# Patient Record
Sex: Female | Born: 1990
Health system: Southern US, Community
[De-identification: ages and names within clinical notes are randomized; demographics above are authoritative.]

## PROBLEM LIST (undated history)

## (undated) DIAGNOSIS — L509 Urticaria, unspecified: Secondary | ICD-10-CM

## (undated) HISTORY — DX: Urticaria, unspecified: L50.9

## (undated) HISTORY — PX: WISDOM TOOTH EXTRACTION: SHX21

---

## 2015-11-01 ENCOUNTER — Encounter (HOSPITAL_COMMUNITY): Payer: Self-pay

## 2015-11-01 ENCOUNTER — Emergency Department (HOSPITAL_COMMUNITY)
Admission: EM | Admit: 2015-11-01 | Discharge: 2015-11-02 | Disposition: A | Discharge status: Home / Self Care | Payer: 59 | Attending: Emergency Medicine | Admitting: Emergency Medicine

## 2015-11-01 DIAGNOSIS — R1013 Epigastric pain: Secondary | ICD-10-CM | POA: Insufficient documentation

## 2015-11-01 DIAGNOSIS — R350 Frequency of micturition: Secondary | ICD-10-CM | POA: Insufficient documentation

## 2015-11-01 DIAGNOSIS — I951 Orthostatic hypotension: Secondary | ICD-10-CM | POA: Insufficient documentation

## 2015-11-01 DIAGNOSIS — R300 Dysuria: Secondary | ICD-10-CM | POA: Insufficient documentation

## 2015-11-01 DIAGNOSIS — Z3202 Encounter for pregnancy test, result negative: Secondary | ICD-10-CM | POA: Diagnosis not present

## 2015-11-01 DIAGNOSIS — E860 Dehydration: Secondary | ICD-10-CM | POA: Insufficient documentation

## 2015-11-01 DIAGNOSIS — K920 Hematemesis: Secondary | ICD-10-CM | POA: Diagnosis present

## 2015-11-01 DIAGNOSIS — R112 Nausea with vomiting, unspecified: Secondary | ICD-10-CM

## 2015-11-01 LAB — POC URINE PREG, ED: PREG TEST UR: NEGATIVE

## 2015-11-01 NOTE — ED Provider Notes (Signed)
CSN: [id number]     Arrival date & time 11/01/15  2319 History  By signing my name below, I, Phillis Haggis, attest that this documentation has been prepared under the direction and in the presence of Dione Booze, MD. Electronically Signed: Phillis Haggis, ED Scribe. 11/01/2015. 12:03 AM.   Chief Complaint  Patient presents with  . Hematemesis   The history is provided by the patient. No language interpreter was used.  HPI Comments: Crystal Simpson is a 25 y.o. female who presents to the Emergency Department complaining of hematemesis x2 onset 4 hours ago. Pt reports vomiting 6x, noticing small blood clots, about 3, the first two times. She states that she continued to see streaks of blood, in the following emesis episodes. She reports associated lightheadedness, dizziness, nausea, frequency, and dysuria. She denies fever, chills, diaphoresis, diarrhea, constipation, urgency, or abdominal pain.  History reviewed. No pertinent past medical history. Past Surgical History  Procedure Laterality Date  . Cesarean section     No family history on file. Social History  Substance Use Topics  . Smoking status: Never Smoker   . Smokeless tobacco: None  . Alcohol Use: No   OB History    No data available     Review of Systems  Constitutional: Negative for fever and chills.  Gastrointestinal: Positive for nausea and vomiting. Negative for abdominal pain, diarrhea and constipation.  Genitourinary: Positive for dysuria and frequency. Negative for urgency.  Neurological: Positive for dizziness and light-headedness.  All other systems reviewed and are negative.  Allergies  Review of patient's allergies indicates no known allergies.  Home Medications   Prior to Admission medications   Not on File   BP 121/74 mmHg  Pulse 86  Temp(Src) 98.4 F (36.9 C) (Oral)  Resp 16  Wt 161 lb 9 oz (73.284 kg)  SpO2 98% Physical Exam  Constitutional: She is oriented to person, place, and time. She  appears well-developed and well-nourished.  HENT:  Head: Normocephalic and atraumatic.  Eyes: EOM are normal. Pupils are equal, round, and reactive to light.  Neck: Normal range of motion. Neck supple. No JVD present.  Cardiovascular: Normal rate, regular rhythm and normal heart sounds.  Exam reveals no gallop and no friction rub.   No murmur heard. Pulmonary/Chest: Effort normal and breath sounds normal. She has no wheezes. She has no rales. She exhibits no tenderness.  Abdominal: Soft. She exhibits no distension and no mass. There is tenderness in the epigastric area.  Mild epigastric tenderness  Musculoskeletal: Normal range of motion. She exhibits no edema.  Lymphadenopathy:    She has no cervical adenopathy.  Neurological: She is alert and oriented to person, place, and time. No cranial nerve deficit. She exhibits normal muscle tone. Coordination normal.  Skin: Skin is warm and dry. No rash noted.  Psychiatric: She has a normal mood and affect. Her behavior is normal. Judgment and thought content normal.  Nursing note and vitals reviewed.   ED Course  Procedures (including critical care time) DIAGNOSTIC STUDIES: Oxygen Saturation is 98% on RA, normal by my interpretation.    COORDINATION OF CARE: 12:01 AM-Discussed treatment plan which includes labs and IV fluids with pt at bedside and pt agreed to plan.    Labs Review Results for orders placed or performed during the hospital encounter of 11/01/15  Lipase, blood  Result Value Ref Range   Lipase 24 11 - 51 U/L  Comprehensive metabolic panel  Result Value Ref Range   Sodium 141 135 -  145 mmol/L   Potassium 3.6 3.5 - 5.1 mmol/L   Chloride 106 101 - 111 mmol/L   CO2 26 22 - 32 mmol/L   Glucose, Bld 109 (H) 65 - 99 mg/dL   BUN 7 6 - 20 mg/dL   Creatinine, Ser 1.61 0.44 - 1.00 mg/dL   Calcium 9.6 8.9 - 09.6 mg/dL   Total Protein 7.2 6.5 - 8.1 g/dL   Albumin 4.0 3.5 - 5.0 g/dL   AST 15 15 - 41 U/L   ALT 10 (L) 14 - 54 U/L    Alkaline Phosphatase 52 38 - 126 U/L   Total Bilirubin 0.8 0.3 - 1.2 mg/dL   GFR calc non Af Amer >60 >60 mL/min   GFR calc Af Amer >60 >60 mL/min   Anion gap 9 5 - 15  CBC  Result Value Ref Range   WBC 14.5 (H) 4.0 - 10.5 K/uL   RBC 4.31 3.87 - 5.11 MIL/uL   Hemoglobin 12.7 12.0 - 15.0 g/dL   HCT 04.5 40.9 - 81.1 %   MCV 90.0 78.0 - 100.0 fL   MCH 29.5 26.0 - 34.0 pg   MCHC 32.7 30.0 - 36.0 g/dL   RDW 91.4 78.2 - 95.6 %   Platelets 289 150 - 400 K/uL  Urinalysis, Routine w reflex microscopic (not at [facility])  Result Value Ref Range   Color, Urine YELLOW YELLOW   APPearance CLOUDY (A) CLEAR   Specific Gravity, Urine 1.027 1.005 - 1.030   pH 7.5 5.0 - 8.0   Glucose, UA NEGATIVE NEGATIVE mg/dL   Hgb urine dipstick NEGATIVE NEGATIVE   Bilirubin Urine NEGATIVE NEGATIVE   Ketones, ur NEGATIVE NEGATIVE mg/dL   Protein, ur NEGATIVE NEGATIVE mg/dL   Nitrite POSITIVE (A) NEGATIVE   Leukocytes, UA SMALL (A) NEGATIVE  Differential  Result Value Ref Range   Neutrophils Relative % 70 %   Neutro Abs 10.1 (H) 1.7 - 7.7 K/uL   Lymphocytes Relative 21 %   Lymphs Abs 3.1 0.7 - 4.0 K/uL   Monocytes Relative 8 %   Monocytes Absolute 1.1 (H) 0.1 - 1.0 K/uL   Eosinophils Relative 1 %   Eosinophils Absolute 0.2 0.0 - 0.7 K/uL   Basophils Relative 0 %   Basophils Absolute 0.0 0.0 - 0.1 K/uL  Urine microscopic-add on  Result Value Ref Range   Squamous Epithelial / LPF 6-30 (A) NONE SEEN   WBC, UA 0-5 0 - 5 WBC/hpf   RBC / HPF NONE SEEN 0 - 5 RBC/hpf   Bacteria, UA MANY (A) NONE SEEN  Hemoglobin and hematocrit, blood  Result Value Ref Range   Hemoglobin 11.7 (L) 12.0 - 15.0 g/dL   HCT 21.3 08.6 - 57.8 %  Hemoglobin and hematocrit, blood  Result Value Ref Range   Hemoglobin 11.8 (L) 12.0 - 15.0 g/dL   HCT 46.9 (L) 62.9 - 52.8 %  POC urine preg, ED (not at [facility])  Result Value Ref Range   Preg Test, Ur NEGATIVE NEGATIVE   I have personally reviewed and evaluated these lab results as part  of my medical decision-making.   MDM   Final diagnoses:  Nausea and vomiting, vomiting of unspecified type  Dehydration  Orthostatic hypotension    Nausea and vomiting with questionable hematemesis. I doubt significant hematemesis and there is no emesis present to test. Clinically, she is acting as a viral gastritis. Orthostatic vital signs were obtained which did show significant rise in pulse. She was given IV  hydration with ondansetron with some improvement in nausea. Hemoglobin was rechecked and had dropped but I was suspicious that this actually was from volume dilution rather than actual hemorrhage. She is observed in the ED and hemoglobin repeated again and was stable. Orthostatic vital signs were checked and she no longer had any significant orthostatic changes. She is discharged with prescription for ondansetron. She was feeling much better at this point.  I personally performed the services described in this documentation, which was scribed in my presence. The recorded information has been reviewed and is accurate.      Dione Booze, MD 11/02/15 [phone]

## 2015-11-01 NOTE — ED Notes (Signed)
Pt here for vomiting x 6 times and noticed blood in the first two times. Feels lightheaded and has been having pain after urinating.

## 2015-11-02 LAB — URINALYSIS, ROUTINE W REFLEX MICROSCOPIC
BILIRUBIN URINE: NEGATIVE
GLUCOSE, UA: NEGATIVE mg/dL
HGB URINE DIPSTICK: NEGATIVE
KETONES UR: NEGATIVE mg/dL
Nitrite: POSITIVE — AB
PH: 7.5 (ref 5.0–8.0)
Protein, ur: NEGATIVE mg/dL
SPECIFIC GRAVITY, URINE: 1.027 (ref 1.005–1.030)

## 2015-11-02 LAB — DIFFERENTIAL
Basophils Absolute: 0 10*3/uL (ref 0.0–0.1)
Basophils Relative: 0 %
EOS ABS: 0.2 10*3/uL (ref 0.0–0.7)
EOS PCT: 1 %
LYMPHS ABS: 3.1 10*3/uL (ref 0.7–4.0)
LYMPHS PCT: 21 %
MONO ABS: 1.1 10*3/uL — AB (ref 0.1–1.0)
Monocytes Relative: 8 %
NEUTROS PCT: 70 %
Neutro Abs: 10.1 10*3/uL — ABNORMAL HIGH (ref 1.7–7.7)

## 2015-11-02 LAB — LIPASE, BLOOD: LIPASE: 24 U/L (ref 11–51)

## 2015-11-02 LAB — URINE MICROSCOPIC-ADD ON: RBC / HPF: NONE SEEN RBC/hpf (ref 0–5)

## 2015-11-02 LAB — COMPREHENSIVE METABOLIC PANEL
ALK PHOS: 52 U/L (ref 38–126)
ALT: 10 U/L — AB (ref 14–54)
AST: 15 U/L (ref 15–41)
Albumin: 4 g/dL (ref 3.5–5.0)
Anion gap: 9 (ref 5–15)
BUN: 7 mg/dL (ref 6–20)
CALCIUM: 9.6 mg/dL (ref 8.9–10.3)
CHLORIDE: 106 mmol/L (ref 101–111)
CO2: 26 mmol/L (ref 22–32)
CREATININE: 0.66 mg/dL (ref 0.44–1.00)
GFR calc non Af Amer: >60 mL/min (ref >60)
Glucose, Bld: 109 mg/dL — ABNORMAL HIGH (ref 65–99)
Potassium: 3.6 mmol/L (ref 3.5–5.1)
SODIUM: 141 mmol/L (ref 135–145)
Total Bilirubin: 0.8 mg/dL (ref 0.3–1.2)
Total Protein: 7.2 g/dL (ref 6.5–8.1)

## 2015-11-02 LAB — CBC
HCT: 38.8 % (ref 36.0–46.0)
Hemoglobin: 12.7 g/dL (ref 12.0–15.0)
MCH: 29.5 pg (ref 26.0–34.0)
MCHC: 32.7 g/dL (ref 30.0–36.0)
MCV: 90 fL (ref 78.0–100.0)
Platelets: 289 10*3/uL (ref 150–400)
RBC: 4.31 MIL/uL (ref 3.87–5.11)
RDW: 13.7 % (ref 11.5–15.5)
WBC: 14.5 10*3/uL — ABNORMAL HIGH (ref 4.0–10.5)

## 2015-11-02 LAB — HEMOGLOBIN AND HEMATOCRIT, BLOOD
HEMATOCRIT: 36.1 % (ref 36.0–46.0)
HEMATOCRIT: 35.9 % — AB (ref 36.0–46.0)
HEMOGLOBIN: 11.7 g/dL — AB (ref 12.0–15.0)
HEMOGLOBIN: 11.8 g/dL — AB (ref 12.0–15.0)

## 2015-11-02 MED ORDER — SODIUM CHLORIDE 0.9 % IV SOLN
1000.0000 mL | Freq: Once | INTRAVENOUS | Status: AC
  Administered 2015-11-02: 1000 mL via INTRAVENOUS

## 2015-11-02 MED ORDER — PANTOPRAZOLE SODIUM 40 MG IV SOLR
40.0000 mg | Freq: Once | INTRAVENOUS | Status: AC
  Administered 2015-11-02: 40 mg via INTRAVENOUS
  Filled 2015-11-02: qty 40

## 2015-11-02 MED ORDER — ONDANSETRON HCL 4 MG/2ML IJ SOLN
4.0000 mg | Freq: Once | INTRAMUSCULAR | Status: AC
  Administered 2015-11-02: 4 mg via INTRAVENOUS
  Filled 2015-11-02: qty 2

## 2015-11-02 MED ORDER — ONDANSETRON HCL 4 MG PO TABS: 4.0000 mg | ORAL_TABLET | Freq: Four times a day (QID) | ORAL | Status: DC

## 2015-11-02 MED ORDER — SODIUM CHLORIDE 0.9 % IV SOLN
1000.0000 mL | INTRAVENOUS | Status: DC
  Administered 2015-11-02: 1000 mL via INTRAVENOUS

## 2015-11-02 NOTE — Discharge Instructions (Signed)
Nausea and Vomiting  Nausea is a sick feeling that often comes before throwing up (vomiting). Vomiting is a reflex where stomach contents come out of your mouth. Vomiting can cause severe loss of body fluids (dehydration). Children and elderly adults can become dehydrated quickly, especially if they also have diarrhea. Nausea and vomiting are symptoms of a condition or disease. It is important to find the cause of your symptoms.  CAUSES   · Direct irritation of the stomach lining. This irritation can result from increased acid production (gastroesophageal reflux disease), infection, food poisoning, taking certain medicines (such as nonsteroidal anti-inflammatory drugs), alcohol use, or tobacco use.  · Signals from the brain. These signals could be caused by a headache, heat exposure, an inner ear disturbance, increased pressure in the brain from injury, infection, a tumor, or a concussion, pain, emotional stimulus, or metabolic problems.  · An obstruction in the gastrointestinal tract (bowel obstruction).  · Illnesses such as diabetes, hepatitis, gallbladder problems, appendicitis, kidney problems, cancer, sepsis, atypical symptoms of a heart attack, or eating disorders.  · Medical treatments such as chemotherapy and radiation.  · Receiving medicine that makes you sleep (general anesthetic) during surgery.  DIAGNOSIS  Your caregiver may ask for tests to be done if the problems do not improve after a few days. Tests may also be done if symptoms are severe or if the reason for the nausea and vomiting is not clear. Tests may include:  · Urine tests.  · Blood tests.  · Stool tests.  · Cultures (to look for evidence of infection).  · X-rays or other imaging studies.  Test results can help your caregiver make decisions about treatment or the need for additional tests.  TREATMENT  You need to stay well hydrated. Drink frequently but in small amounts. You may wish to drink water, sports drinks, clear broth, or eat frozen  ice pops or gelatin dessert to help stay hydrated. When you eat, eating slowly may help prevent nausea. There are also some antinausea medicines that may help prevent nausea.  HOME CARE INSTRUCTIONS   · Take all medicine as directed by your caregiver.  · If you do not have an appetite, do not force yourself to eat. However, you must continue to drink fluids.  · If you have an appetite, eat a normal diet unless your caregiver tells you differently.    Eat a variety of complex carbohydrates (rice, wheat, potatoes, bread), lean meats, yogurt, fruits, and vegetables.    Avoid high-fat foods because they are more difficult to digest.  · Drink enough water and fluids to keep your urine clear or pale yellow.  · If you are dehydrated, ask your caregiver for specific rehydration instructions. Signs of dehydration may include:    Severe thirst.    Dry lips and mouth.    Dizziness.    Dark urine.    Decreasing urine frequency and amount.    Confusion.    Rapid breathing or pulse.  SEEK IMMEDIATE MEDICAL CARE IF:   · You have blood or brown flecks (like coffee grounds) in your vomit.  · You have black or bloody stools.  · You have a severe headache or stiff neck.  · You are confused.  · You have severe abdominal pain.  · You have chest pain or trouble breathing.  · You do not urinate at least once every 8 hours.  · You develop cold or clammy skin.  · You continue to vomit for longer than 24 to 48 hours.  ·   You have a fever.  MAKE SURE YOU:   · Understand these instructions.  · Will watch your condition.  · Will get help right away if you are not doing well or get worse.     This information is not intended to replace advice given to you by your health care provider. Make sure you discuss any questions you have with your health care provider.     Document Released: 09/13/2005 Document Revised: 12/06/2011 Document Reviewed: 02/10/2011  Elsevier Interactive Patient Education ©2016 Elsevier [employer].  Ondansetron tablets  What is this  medicine?  ONDANSETRON (on DAN se tron) is used to treat nausea and vomiting caused by chemotherapy. It is also used to prevent or treat nausea and vomiting after surgery.  This medicine may be used for other purposes; ask your health care provider or pharmacist if you have questions.  What should I tell my health care provider before I take this medicine?  They need to know if you have any of these conditions:  -heart disease  -history of irregular heartbeat  -liver disease  -low levels of magnesium or potassium in the blood  -an unusual or allergic reaction to ondansetron, granisetron, other medicines, foods, dyes, or preservatives  -pregnant or trying to get pregnant  -breast-feeding  How should I use this medicine?  Take this medicine by mouth with a glass of water. Follow the directions on your prescription label. Take your doses at regular intervals. Do not take your medicine more often than directed.  Talk to your pediatrician regarding the use of this medicine in children. Special care may be needed.  Overdosage: If you think you have taken too much of this medicine contact a poison control center or emergency room at once.  NOTE: This medicine is only for you. Do not share this medicine with others.  What if I miss a dose?  If you miss a dose, take it as soon as you can. If it is almost time for your next dose, take only that dose. Do not take double or extra doses.  What may interact with this medicine?  Do not take this medicine with any of the following medications:  -apomorphine  -certain medicines for fungal infections like fluconazole, itraconazole, ketoconazole, posaconazole, voriconazole  -cisapride  -dofetilide  -dronedarone  -pimozide  -thioridazine  -ziprasidone  This medicine may also interact with the following medications:  -carbamazepine  -certain medicines for depression, anxiety, or psychotic disturbances  -fentanyl  -linezolid  -MAOIs like Carbex, Eldepryl, Marplan, Nardil, and  Parnate  -methylene blue (injected into a vein)  -other medicines that prolong the QT interval (cause an abnormal heart rhythm)  -phenytoin  -rifampicin  -tramadol  This list may not describe all possible interactions. Give your health care provider a list of all the medicines, herbs, non-prescription drugs, or dietary supplements you use. Also tell them if you smoke, drink alcohol, or use illegal drugs. Some items may interact with your medicine.  What should I watch for while using this medicine?  Check with your doctor or health care professional right away if you have any sign of an allergic reaction.  What side effects may I notice from receiving this medicine?  Side effects that you should report to your doctor or health care professional as soon as possible:  -allergic reactions like skin rash, itching or hives, swelling of the face, lips or tongue  -breathing problems  -confusion  -dizziness  -fast or irregular heartbeat  -feeling faint or lightheaded,   falls  -fever and chills  -loss of balance or coordination  -seizures  -sweating  -swelling of the hands or feet  -tightness in the chest  -tremors  -unusually weak or tired  Side effects that usually do not require medical attention (report to your doctor or health care professional if they continue or are bothersome):  -constipation or diarrhea  -headache  This list may not describe all possible side effects. Call your doctor for medical advice about side effects. You may report side effects to FDA at 1-800-FDA-1088.  Where should I keep my medicine?  Keep out of the reach of children.  Store between 2 and 30 degrees C (36 and 86 degrees F). Throw away any unused medicine after the expiration date.  NOTE: This sheet is a summary. It may not cover all possible information. If you have questions about this medicine, talk to your doctor, pharmacist, or health care provider.     © 2016, Elsevier/Gold Standard. (2013-06-20 16:27:45)

## 2016-10-14 ENCOUNTER — Emergency Department (HOSPITAL_COMMUNITY)
Admission: EM | Admit: 2016-10-14 | Discharge: 2016-10-14 | Disposition: A | Discharge status: Home / Self Care | Payer: 59 | Attending: Emergency Medicine | Admitting: Emergency Medicine

## 2016-10-14 ENCOUNTER — Encounter (HOSPITAL_COMMUNITY): Payer: Self-pay

## 2016-10-14 DIAGNOSIS — N3000 Acute cystitis without hematuria: Secondary | ICD-10-CM

## 2016-10-14 DIAGNOSIS — Z79899 Other long term (current) drug therapy: Secondary | ICD-10-CM | POA: Insufficient documentation

## 2016-10-14 DIAGNOSIS — Z202 Contact with and (suspected) exposure to infections with a predominantly sexual mode of transmission: Secondary | ICD-10-CM

## 2016-10-14 LAB — PREGNANCY, URINE: Preg Test, Ur: NEGATIVE

## 2016-10-14 LAB — WET PREP, GENITAL
Sperm: NONE SEEN
TRICH WET PREP: NONE SEEN
Yeast Wet Prep HPF POC: NONE SEEN

## 2016-10-14 LAB — URINALYSIS, ROUTINE W REFLEX MICROSCOPIC
Bilirubin Urine: NEGATIVE
Glucose, UA: NEGATIVE mg/dL
Hgb urine dipstick: NEGATIVE
KETONES UR: NEGATIVE mg/dL
Nitrite: NEGATIVE
PROTEIN: NEGATIVE mg/dL
SQUAMOUS EPITHELIAL / LPF: NONE SEEN
Specific Gravity, Urine: 1.025 (ref 1.005–1.030)
pH: 6 (ref 5.0–8.0)

## 2016-10-14 MED ORDER — CEPHALEXIN 500 MG PO CAPS: 500.0000 mg | ORAL_CAPSULE | Freq: Three times a day (TID) | ORAL | 0 refills | Status: DC

## 2016-10-14 MED ORDER — LIDOCAINE HCL 1 % IJ SOLN
INTRAMUSCULAR | Status: AC
  Administered 2016-10-14: 0.9 mL
  Filled 2016-10-14: qty 20

## 2016-10-14 MED ORDER — CEFTRIAXONE SODIUM 250 MG IJ SOLR
250.0000 mg | Freq: Once | INTRAMUSCULAR | Status: AC
  Administered 2016-10-14: 250 mg via INTRAMUSCULAR
  Filled 2016-10-14: qty 250

## 2016-10-14 MED ORDER — AZITHROMYCIN 250 MG PO TABS
1000.0000 mg | ORAL_TABLET | Freq: Once | ORAL | Status: AC
  Administered 2016-10-14: 1000 mg via ORAL
  Filled 2016-10-14: qty 4

## 2016-10-14 NOTE — ED Notes (Signed)
Patient d/c'd self care.  F/U and medications reviewed.  Patient verbalized understanding. 

## 2016-10-14 NOTE — ED Provider Notes (Signed)
[facility]-EMERGENCY DEPT Provider Note   CSN: [id number] Arrival date & time: 10/14/16  1618     History   Chief Complaint Chief Complaint  Patient presents with  . Exposure to STD    HPI Crystal Simpson is a 26 y.o. female who presents with STI exposure. No significant PMH. She states that her husband was notified that he tested positive for Chlamydia yesterday and she would like to be tested. She reports some mild discomfort from sex and intermittent dysuria. She has an IUD. Denies fever, chills, abdominal pain, N/V, vaginal discharge.   HPI  History reviewed. No pertinent past medical history.  There are no active problems to display for this patient.   Past Surgical History:  Procedure Laterality Date  . CESAREAN SECTION      OB History    No data available       Home Medications    Prior to Admission medications   Medication Sig Start Date End Date Taking? Authorizing Provider  levonorgestrel (MIRENA) 20 MCG/24HR IUD 1 each by Intrauterine route once.    Historical Provider, MD  ondansetron (ZOFRAN) 4 MG tablet Take 1 tablet (4 mg total) by mouth every 6 (six) hours. Patient not taking: Reported on 10/14/2016 11/02/15   Dione Boozeavid Glick, MD    Family History History reviewed. No pertinent family history.  Social History Social History  Substance Use Topics  . Smoking status: Never Smoker  . Smokeless tobacco: Never Used  . Alcohol use No     Allergies   Patient has no known allergies.   Review of Systems Review of Systems  Constitutional: Negative for chills and fever.  Gastrointestinal: Negative for abdominal pain, nausea and vomiting.  Genitourinary: Positive for dyspareunia and dysuria. Negative for genital sores, pelvic pain and vaginal discharge.  All other systems reviewed and are negative.    Physical Exam Updated Vital Signs BP 115/73 (BP Location: Left Arm)   Pulse 72   Temp 98 F (36.7 C) (Oral)   Resp 14   Ht 5\' 4"  (1.626 m)   Wt 82.4  kg   SpO2 99%   BMI 31.17 kg/m   Physical Exam  Constitutional: She is oriented to person, place, and time. She appears well-developed and well-nourished. No distress.  HENT:  Head: Normocephalic and atraumatic.  Eyes: Conjunctivae are normal. Pupils are equal, round, and reactive to light. Right eye exhibits no discharge. Left eye exhibits no discharge. No scleral icterus.  Neck: Normal range of motion.  Cardiovascular: Normal rate and regular rhythm.  Exam reveals no gallop and no friction rub.   No murmur heard. Pulmonary/Chest: Effort normal and breath sounds normal. No respiratory distress. She has no wheezes. She has no rales. She exhibits no tenderness.  Abdominal: Soft. Bowel sounds are normal. She exhibits no distension and no mass. There is no tenderness. There is no rebound and no guarding. No hernia.  Well-healed C-section scar  Genitourinary:  Genitourinary Comments: Pelvic: No inguinal lymphadenopathy or inguinal hernia noted. Normal external genitalia. No pain with speculum insertion. Closed cervical os with normal appearance - no rash or lesions. IUD strings in place. No significant discharge or bleeding noted from cervix or in vaginal vault. On bimanual examination no adnexal tenderness or cervical motion tenderness. Chaperone present during exam.    Neurological: She is alert and oriented to person, place, and time.  Skin: Skin is warm and dry.  Psychiatric: She has a normal mood and affect. Her behavior is normal.  Nursing  note and vitals reviewed.    ED Treatments / Results  Labs (all labs ordered are listed, but only abnormal results are displayed) Labs Reviewed  WET PREP, GENITAL - Abnormal; Notable for the following:       Result Value   Clue Cells Wet Prep HPF POC PRESENT (*)    WBC, Wet Prep HPF POC FEW (*)    All other components within normal limits  URINALYSIS, ROUTINE W REFLEX MICROSCOPIC - Abnormal; Notable for the following:    Leukocytes, UA SMALL  (*)    Bacteria, UA MANY (*)    All other components within normal limits  PREGNANCY, URINE  RPR  HIV ANTIBODY (ROUTINE TESTING)  GC/CHLAMYDIA PROBE AMP ([place]) NOT AT [facility]    EKG  EKG Interpretation None       Radiology No results found.  Procedures Procedures (including critical care time)  Medications Ordered in ED Medications  cefTRIAXone (ROCEPHIN) injection 250 mg (250 mg Intramuscular Given 10/14/16 1952)  azithromycin (ZITHROMAX) tablet 1,000 mg (1,000 mg Oral Given 10/14/16 1952)  lidocaine (XYLOCAINE) 1 % (with pres) injection (0.9 mLs  Given 10/14/16 1952)     Initial Impression / Assessment and Plan / ED Course  I have reviewed the triage vital signs and the nursing notes.  Pertinent labs & imaging results that were available during my care of the patient were reviewed by me and considered in my medical decision making (see chart for details).  26 year old female presents with STD exposure. Patient is afebrile, not tachycardic or tachypneic, normotensive, and not hypoxic. Pelvic exam is unremarkable. Wet prep shows clue cells and few WBC. Pt denies vaginal discharge so will not treat for BV. UA shows many bacteria, small leukocytes, 6-30 WBC. Will treat for UTI. G&C sent. HIV and RPR sent. Patient is NAD, non-toxic, with stable VS. Patient is informed of clinical course, understands medical decision making process, and agrees with plan. Opportunity for questions provided and all questions answered. Return precautions given.   Final Clinical Impressions(s) / ED Diagnoses   Final diagnoses:  STD exposure  Acute cystitis without hematuria    New Prescriptions New Prescriptions   No medications on file     Bethel Born, PA-C 10/14/16 2039    Melene Plan, DO 10/14/16 2319

## 2016-10-14 NOTE — ED Triage Notes (Signed)
PT STS HER HUSBAND HAS TESTED POSITIVE FOR CHLAMYDIA AND SHE WOULD LIKE TO BE CHECKED, AS WELL. PT DENIES VAGINAL D/C OR IRRITATION, BUT HAS BEEN HAVING URINARY FREQUENCY AND BURNING WITH URINATION AT TIMES.

## 2016-10-15 LAB — GC/CHLAMYDIA PROBE AMP (~~LOC~~) NOT AT ARMC
Chlamydia: POSITIVE — AB
NEISSERIA GONORRHEA: NEGATIVE

## 2016-10-15 LAB — RPR: RPR: NONREACTIVE

## 2016-10-15 LAB — HIV ANTIBODY (ROUTINE TESTING W REFLEX): HIV SCREEN 4TH GENERATION: NONREACTIVE

## 2017-09-15 ENCOUNTER — Other Ambulatory Visit: Payer: Self-pay

## 2017-09-15 ENCOUNTER — Encounter (HOSPITAL_COMMUNITY): Payer: Self-pay

## 2017-09-15 ENCOUNTER — Emergency Department (HOSPITAL_COMMUNITY)
Admission: EM | Admit: 2017-09-15 | Discharge: 2017-09-16 | Disposition: A | Discharge status: Home / Self Care | Payer: BLUE CROSS/BLUE SHIELD | Attending: Emergency Medicine | Admitting: Emergency Medicine

## 2017-09-15 ENCOUNTER — Emergency Department (HOSPITAL_COMMUNITY): Payer: BLUE CROSS/BLUE SHIELD

## 2017-09-15 DIAGNOSIS — Z975 Presence of (intrauterine) contraceptive device: Secondary | ICD-10-CM | POA: Insufficient documentation

## 2017-09-15 DIAGNOSIS — M79642 Pain in left hand: Secondary | ICD-10-CM | POA: Diagnosis not present

## 2017-09-15 NOTE — Discharge Instructions (Signed)
Your x-ray did not show a fracture/broken bone; however when you are tender over the area on your hand call the anatomic snuffbox, we treat that as if you have a scaphoid fracture.   Please wear the splint until you are seen by Dr. Merlyn LotKuzma in his office in about 1 week.  Keep the splint clean and dry.  You can cover it with a plastic bag if you need to shower.  Take 600 mg of ibuprofen with food or 650 mg of Tylenol every 6 hours as needed for pain control.   If you develop any new or worsening symptoms, including numbness or weakness in the right hand or fingers, if the fingers or fingertips become cold or blue, or other concerning symptoms, please return to the emergency department for re-evaluation.

## 2017-09-15 NOTE — ED Notes (Signed)
Ortho paged for thumb spica 

## 2017-09-15 NOTE — ED Triage Notes (Signed)
Pt endorses tripping on a piece of equipment at home and tried to catch herself with her left hand and now has left wrist/hand pain. Able to make fist and full ROM of wrist. VSS.

## 2017-09-15 NOTE — ED Notes (Signed)
See EDP assessment 

## 2017-09-15 NOTE — ED Provider Notes (Signed)
[facility] Provider Note   CSN: [id number] Arrival date & time: 09/15/17  2040     History   Chief Complaint Chief Complaint  Patient presents with  . Hand Pain    HPI Crystal Simpson is a 26 y.o. female who presents to the emergency department with a chief complaint of constant left hand pain that began at approximately 8:30 PM after she tripped while walking at home and placed the back of her left hand up to try and catch herself, when she feel backwards towards a wall.  She denies hitting her head, LOC, nausea, or emesis.  No previous left hand injuries or surgeries.  She reports that the pain radiates up the left thumb.  She denies numbness or weakness.  Patient is right-handed.  No treatment prior to arrival.  The history is provided by the patient. No language interpreter was used.    History reviewed. No pertinent past medical history.  There are no active problems to display for this patient.   Past Surgical History:  Procedure Laterality Date  . CESAREAN SECTION      OB History    No data available       Home Medications    Prior to Admission medications   Medication Sig Start Date End Date Taking? Authorizing Provider  cephALEXin (KEFLEX) 500 MG capsule Take 1 capsule (500 mg total) by mouth 3 (three) times daily. 10/14/16   Bethel BornGekas, Kelly Marie, PA-C  levonorgestrel (MIRENA) 20 MCG/24HR IUD 1 each by Intrauterine route once.    [provider]    Family History History reviewed. No pertinent family history.  Social History Social History   Tobacco Use  . Smoking status: Never Smoker  . Smokeless tobacco: Never Used  Substance Use Topics  . Alcohol use: Yes    Comment: occ  . Drug use: No     Allergies   Patient has no known allergies.   Review of Systems Review of Systems  Musculoskeletal: Positive for arthralgias and myalgias.  Skin: Negative for color change and wound.  Neurological:  Negative for weakness and numbness.     Physical Exam Updated Vital Signs BP 110/75 (BP Location: Right Arm)   Pulse 83   Temp 98 F (36.7 C) (Oral)   Resp 16   Ht 5\' 4"  (1.626 m)   Wt 83.9 kg (185 lb)   SpO2 98%   BMI 31.76 kg/m   Physical Exam  Constitutional: No distress.  HENT:  Head: Normocephalic.  Eyes: Conjunctivae are normal.  Neck: Neck supple.  Cardiovascular: Normal rate and regular rhythm. Exam reveals no gallop and no friction rub.  No murmur heard. Pulmonary/Chest: Effort normal. No respiratory distress.  Abdominal: Soft. She exhibits no distension.  Musculoskeletal:  Tender to palpation over the anatomic snuffbox on the left hand.  Tender to palpation to the thumb and radial aspect of the left hand.  Mild edema over the radial aspect of the left hand and the base of the thumb. 5 out of 5 strength against resistance of the individual fingers of the left hand.  Good capillary refill.  Sensation is intact throughout the left wrist and hand.  No overlying abrasions, wounds, erythema, or warm.   Full active and passive range of motion of the left wrist and elbow.  Neurological: She is alert.  Skin: Skin is warm. No rash noted.  Psychiatric: Her behavior is normal.  Nursing note and vitals reviewed.    ED  Treatments / Results  Labs (all labs ordered are listed, but only abnormal results are displayed) Labs Reviewed - No data to display  EKG  EKG Interpretation None       Radiology Dg Wrist Complete Left  Result Date: 09/15/2017 CLINICAL DATA:  Pain following fall EXAM: LEFT WRIST - COMPLETE 3+ VIEW COMPARISON:  None. FINDINGS: Frontal, oblique, lateral, and ulnar deviation scaphoid images were obtained. No fracture or dislocation. Joint spaces appear normal. No erosive change. IMPRESSION: No fracture or dislocation.  No evident arthropathy. Electronically Signed   By: Bretta BangWilliam  Woodruff III M.D.   On: 09/15/2017 21:35   Dg Hand Complete Left  Result  Date: 09/15/2017 CLINICAL DATA:  Pain following fall EXAM: LEFT HAND - COMPLETE 3+ VIEW COMPARISON:  None. FINDINGS: Frontal, oblique, and lateral views were obtained. No evident fracture or dislocation. Joint spaces appear normal. No erosive change. IMPRESSION: No fracture or dislocation.  No evident arthropathy. Electronically Signed   By: Bretta BangWilliam  Woodruff III M.D.   On: 09/15/2017 21:36    Procedures Procedures (including critical care time)  Medications Ordered in ED Medications - No data to display   Initial Impression / Assessment and Plan / ED Course  I have reviewed the triage vital signs and the nursing notes.  Pertinent labs & imaging results that were available during my care of the patient were reviewed by me and considered in my medical decision making (see chart for details).     Patient X-Ray negative for obvious fracture or dislocation.  Patient declined pain management in the ED.  Patient is tender over the left anatomic snuffbox.  Will treat as scaphoid fracture patient given brace while in ED referral to hand surgery provided..  Strict return precautions given.  No acute distress.  She is hemodynamically stable.  Patient will be dc home & is agreeable with above plan.  Final Clinical Impressions(s) / ED Diagnoses   Final diagnoses:  Left hand pain    ED Discharge Orders    None       Barkley BoardsMcDonald, Heyward Douthit A, PA-C 09/15/17 2343    Donnetta Hutchingook, Brian, MD 09/17/17 [phone]

## 2017-09-16 NOTE — Progress Notes (Signed)
Orthopedic Tech Progress Note Patient Details:  Crystal Simpson 01-14-1991 [id number]  Ortho Devices Type of Ortho Device: Thumb velcro splint Ortho Device/Splint Location: lue. dr approved velcro thumb spica Ortho Device/Splint Interventions: Ordered, Application, Adjustment   Post Interventions Patient Tolerated: Well Instructions Provided: Care of device, Adjustment of device   Trinna PostMartinez, Dejha King J 09/16/2017, 12:03 AM

## 2018-06-06 ENCOUNTER — Encounter: Payer: Self-pay | Admitting: Student

## 2018-06-06 ENCOUNTER — Ambulatory Visit (INDEPENDENT_AMBULATORY_CARE_PROVIDER_SITE_OTHER): Payer: BLUE CROSS/BLUE SHIELD | Admitting: Student

## 2018-06-06 VITALS — BP 118/72 | HR 70 | Ht 64.0 in | Wt 189.2 lb

## 2018-06-06 DIAGNOSIS — Z975 Presence of (intrauterine) contraceptive device: Secondary | ICD-10-CM | POA: Diagnosis not present

## 2018-06-06 DIAGNOSIS — Z113 Encounter for screening for infections with a predominantly sexual mode of transmission: Secondary | ICD-10-CM

## 2018-06-06 DIAGNOSIS — Z202 Contact with and (suspected) exposure to infections with a predominantly sexual mode of transmission: Secondary | ICD-10-CM | POA: Diagnosis not present

## 2018-06-06 DIAGNOSIS — N938 Other specified abnormal uterine and vaginal bleeding: Secondary | ICD-10-CM | POA: Diagnosis not present

## 2018-06-06 LAB — POCT PREGNANCY, URINE: Preg Test, Ur: NEGATIVE

## 2018-06-06 NOTE — Progress Notes (Signed)
Pt states has been spotting & cramping since this morning, has not had a period in over a year.

## 2018-06-06 NOTE — Patient Instructions (Signed)

## 2018-06-06 NOTE — Progress Notes (Signed)
Patient ID: Crystal Simpson, female   DOB: Sep 03, 1991, 27 y.o.   MRN: [id number] History:  Ms. Crystal Simpson is a 27 y.o. G2P2 . who presents to clinic today for spotting that started this morning. She has had to change her pantyliner twice today; has not had to use a heavy pad. She also rates her cramps a 3/10. She would like to be checked for gonorrhea and chlamydia and trich since she just found out that her husband has been unfaithful.   The cramps feel like menstrual cramps  but a little bit "different". She has not tried anything for them, nothing makes them better or worse.   The following portions of the patient's history were reviewed and updated as appropriate: allergies, current medications, family history, past medical history, social history, past surgical history and problem list.  Review of Systems:  Review of Systems  Constitutional: Negative.   HENT: Negative.   Respiratory: Negative.   Cardiovascular: Negative.   Gastrointestinal: Positive for abdominal pain.  Genitourinary: Negative.   Skin: Negative.   Neurological: Negative.   Psychiatric/Behavioral: Negative.       Objective:  Physical Exam BP 118/72   Pulse 70   Ht 5\' 4"  (1.626 m)   Wt 189 lb 3.2 oz (85.8 kg)   BMI 32.48 kg/m  Physical Exam  Constitutional: She appears well-developed.  HENT:  Head: Normocephalic.  Neck: Normal range of motion.  Abdominal: Soft.  Musculoskeletal: Normal range of motion.  Neurological: She is alert.  Skin: Skin is warm.  Normal external female genitalia; two IUD strings visualzed. Light pink blood in the vagina; no lesions.  No CMT, suprapubic or adnexal tenderness. Uterus is non-tender.    Labs and Imaging Results for orders placed or performed in visit on 06/06/18 (from the past 24 hour(s))  Pregnancy, urine POC     Status: None   Collection Time: 06/06/18  4:26 PM  Result Value Ref Range   Preg Test, Ur NEGATIVE NEGATIVE    No results found.  UPT was negative.     Assessment & Plan:   1. Possible exposure to STD   2. Dysfunctional uterine bleeding   3. Intrauterine device    2. Patient will return in one month for follow-up GC chlamydia, HIV, RPR, Hep B.  3. Counseled patient that it is normal to have occasional bleeding on Mirena; reviewed bleeding precautions.    Crystal Simpson, CNM 06/06/2018 5:19 PM

## 2018-06-08 LAB — GC/CHLAMYDIA PROBE AMP (~~LOC~~) NOT AT ARMC
Chlamydia: NEGATIVE
Neisseria Gonorrhea: NEGATIVE

## 2018-07-04 ENCOUNTER — Ambulatory Visit: Payer: BLUE CROSS/BLUE SHIELD | Admitting: Student

## 2018-08-04 IMAGING — DX DG WRIST COMPLETE 3+V*L*
4 series · 4 of 4 positions shown · non-contrast
Comparison: None.

CLINICAL DATA: Pain following fall

EXAM:
LEFT WRIST - COMPLETE 3+ VIEW

[wrist pa]
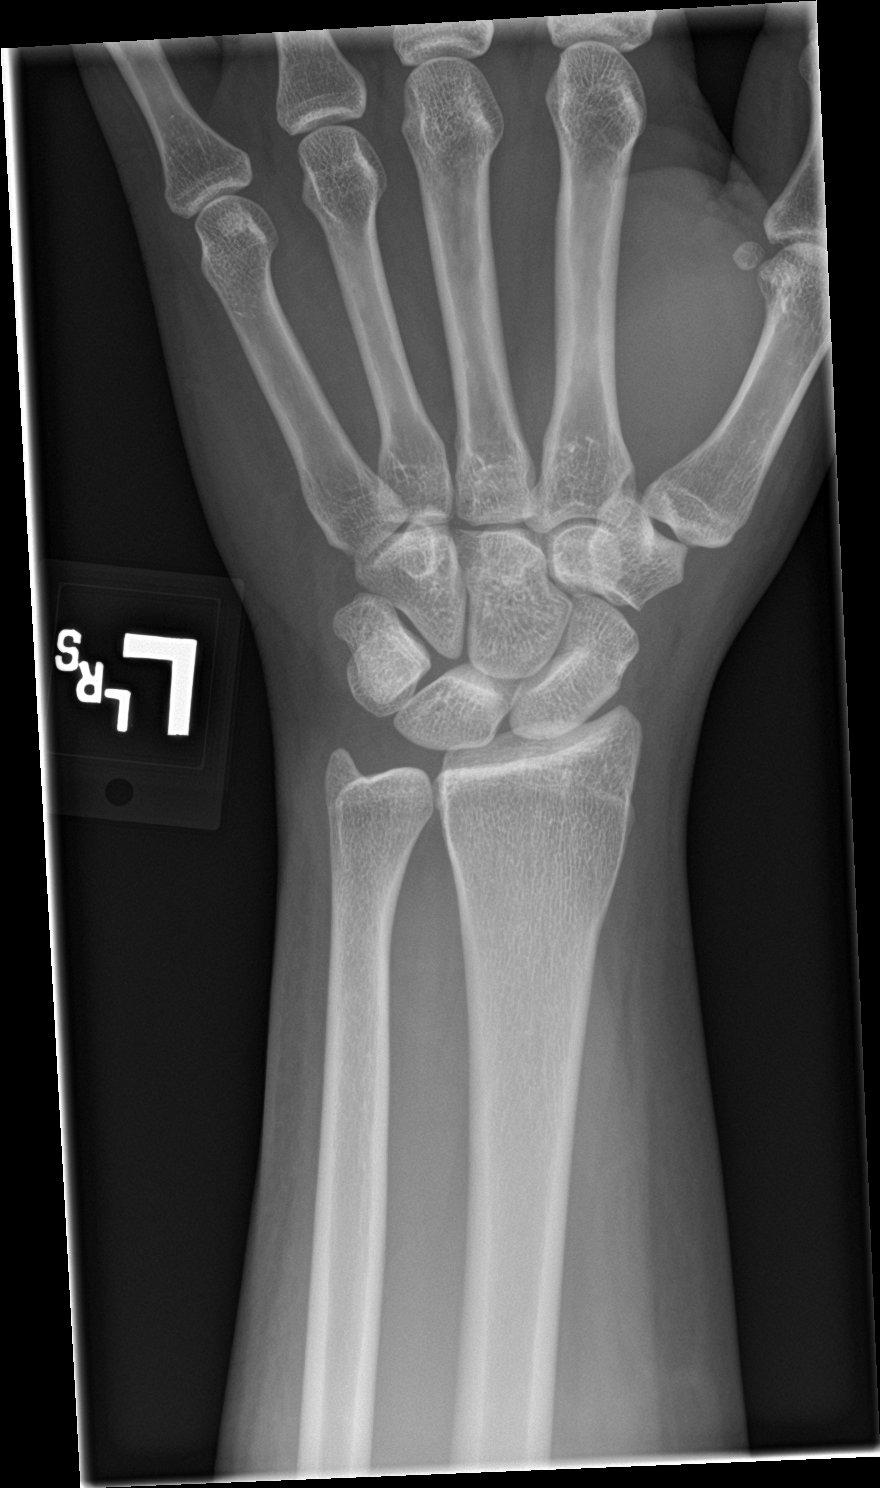

[wrist obl]
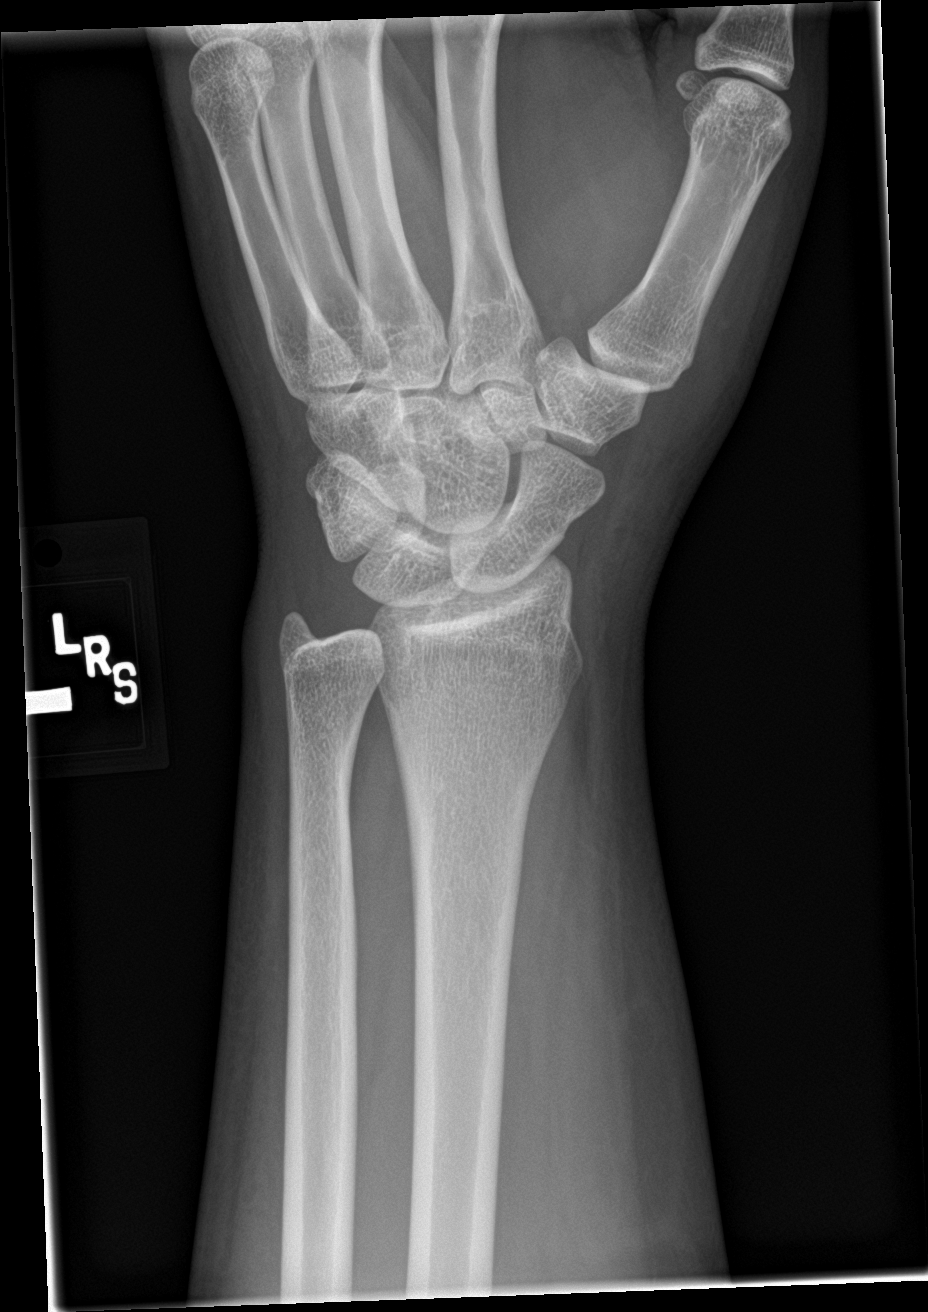

[wrist lat]
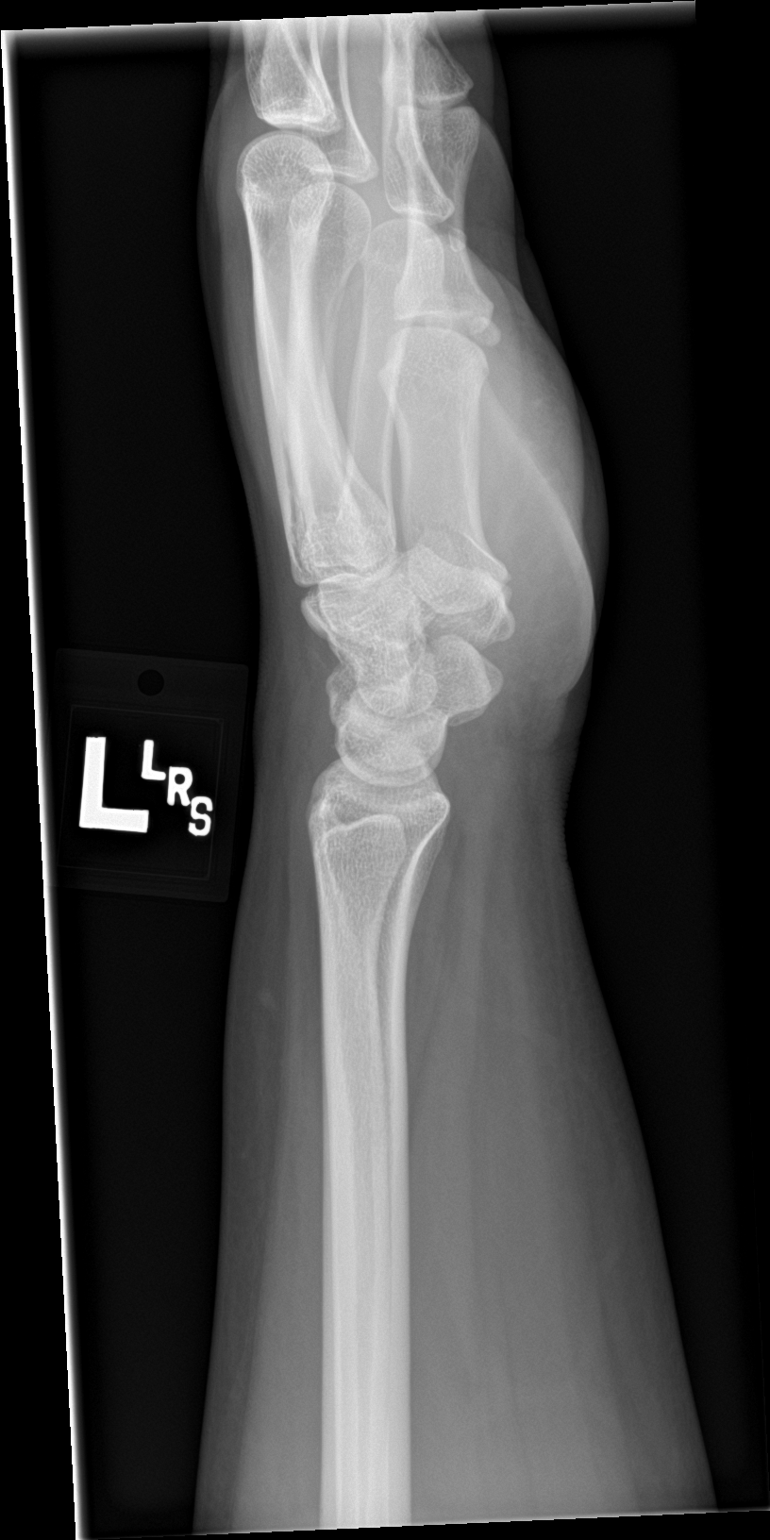

[wrist navicular]
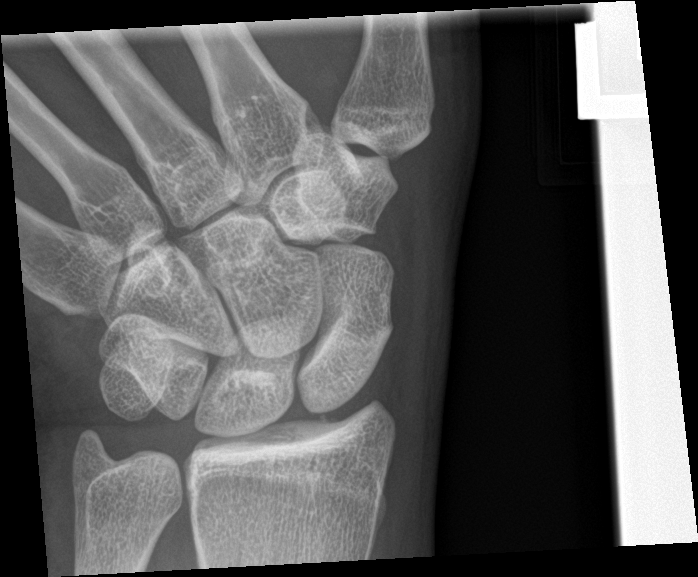

[4 of 4 positions shown; findings below may reference images not displayed]

FINDINGS: Frontal, oblique, lateral, and ulnar deviation scaphoid images were
obtained. No fracture or dislocation. Joint spaces appear normal. No
erosive change.
IMPRESSION: No fracture or dislocation.  No evident arthropathy.

## 2019-07-11 ENCOUNTER — Encounter: Payer: Self-pay | Admitting: Family Medicine

## 2019-07-11 ENCOUNTER — Other Ambulatory Visit: Payer: Self-pay

## 2019-07-11 ENCOUNTER — Ambulatory Visit (INDEPENDENT_AMBULATORY_CARE_PROVIDER_SITE_OTHER): Payer: 59 | Admitting: Family Medicine

## 2019-07-11 VITALS — BP 108/72 | HR 80 | Temp 99.0°F | Ht 64.0 in | Wt 195.0 lb

## 2019-07-11 DIAGNOSIS — L509 Urticaria, unspecified: Secondary | ICD-10-CM | POA: Diagnosis not present

## 2019-07-11 DIAGNOSIS — Z13220 Encounter for screening for lipoid disorders: Secondary | ICD-10-CM

## 2019-07-11 DIAGNOSIS — Z0000 Encounter for general adult medical examination without abnormal findings: Secondary | ICD-10-CM

## 2019-07-11 DIAGNOSIS — Z0001 Encounter for general adult medical examination with abnormal findings: Secondary | ICD-10-CM

## 2019-07-11 DIAGNOSIS — Z130 Encounter for screening for diseases of the blood and blood-forming organs and certain disorders involving the immune mechanism: Secondary | ICD-10-CM | POA: Diagnosis not present

## 2019-07-11 NOTE — Patient Instructions (Signed)
° ° ° °  If you have lab work done today you will be contacted with your lab results within the next 2 weeks.  If you have not heard from us then please contact us. The fastest way to get your results is to register for My Chart. ° ° °IF you received an x-ray today, you will receive an invoice from [facility]. Please contact [facility] at [phone] with questions or concerns regarding your invoice.  ° °IF you received labwork today, you will receive an invoice from [facility]. Please contact [facility] at [phone] with questions or concerns regarding your invoice.  ° °Our billing staff will not be able to assist you with questions regarding bills from these companies. ° °You will be contacted with the lab results as soon as they are available. The fastest way to get your results is to activate your My Chart account. Instructions are located on the last page of this paperwork. If you have not heard from us regarding the results in 2 weeks, please contact this office. °  ° ° ° °

## 2019-07-11 NOTE — Progress Notes (Signed)
New Patient Office Visit  Subjective:  Patient ID: Crystal Simpson, female    DOB: [birth year]  Age: 28 y.o. MRN: [id number]  CC:  Chief Complaint  Patient presents with  . Urticaria    comes all over the body, takes zyrtec seems to help. Has pictures. Thought it may be stress. 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 (Menstrual status: IUD). She is ready for her IUD to be pulled to have another baby.   She reports that her eye exam and dental exam are up to date.   History reviewed. No pertinent past medical history.  Past Surgical History:  Procedure Laterality Date  . CESAREAN SECTION      Family History  Problem Relation Age of Onset  . Rheum arthritis Mother   . Healthy Father   . Healthy Sister   . Healthy Brother   . Healthy Daughter   . Diabetes Maternal Grandmother   . Hypertension Maternal Grandfather   . Parkinson's disease Paternal Grandmother     Social History   Socioeconomic History  . Marital status: Married    Spouse name: Not on file  . Number of children: Not on file  . Years of education: Not on file  . Highest education level: Not on file  Occupational History  . Occupation: nurse  Social Needs  . Financial resource strain: Not hard at all  . Food insecurity    Worry: Never true    Inability: Never true  . Transportation needs    Medical: No    Non-medical: No  Tobacco Use  . Smoking status:  Never Smoker  . Smokeless tobacco: Never Used  Substance and Sexual Activity  . Alcohol use: Yes    Comment: occ  . Drug use: No  . Sexual activity: Yes    Birth control/protection: I.U.D.  Lifestyle  . Physical activity    Days per week: Not on file    Minutes per session: Not on file  . Stress: Not on file  Relationships  . Social Herbalist on phone: Not on file    Gets together: Not on file    Attends religious service: Not on file    Active member of club or organization: Not on file    Attends meetings of clubs or organizations: Not on file    Relationship status: Married  . Intimate partner violence    Fear of current or ex partner: No    Emotionally abused: No    Physically abused: No    Forced sexual activity: No  Other Topics Concern  . 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  HENT: Negative for congestion, nosebleeds, trouble swallowing and voice change.   Respiratory: Negative for cough, shortness of breath and  wheezing.   Gastrointestinal: Negative for diarrhea, nausea and vomiting.  Genitourinary: Negative for difficulty urinating, dysuria, flank pain and hematuria.  Musculoskeletal: Negative for back pain, joint swelling and neck pain.  Neurological: Negative for dizziness, speech difficulty, light-headedness and numbness.  See HPI. All other review of systems negative.   Objective:   Today's Vitals: BP 108/72   Pulse 80   Temp 99 F (37.2 C)   Ht '5\' 4"'  (1.626 m)   Wt 195 lb (88.5 kg)   SpO2 100%   BMI 33.47 kg/m   BP 108/72   Pulse 80   Temp 99 F (37.2 C)   Ht '5\' 4"'  (1.626 m)   Wt 195 lb (88.5 kg)   SpO2 100%   BMI 33.47 kg/m   General Appearance:    Alert, cooperative, no distress, appears stated age  Head:    Normocephalic, without obvious abnormality, atraumatic  Eyes:    PERRL,  conjunctiva/corneas clear, EOM's intact  Ears:    Normal TM's and external ear canals, both ears  Nose:   Nares normal, septum midline, mucosa normal, no drainage    or sinus tenderness  Throat:   Lips, mucosa, and tongue normal; teeth and gums normal  Neck:   Supple, symmetrical, trachea midline, no adenopathy;    thyroid:  no enlargement/tenderness/nodules  Back:     Symmetric, no curvature, ROM normal, no CVA tenderness  Lungs:     Clear to auscultation bilaterally, respirations unlabored  Chest Wall:    No tenderness or deformity   Heart:    Regular rate and rhythm, S1 and S2 normal, no murmur, rub   or gallop  Abdomen:     Soft, non-tender, bowel sounds active all four quadrants,    no masses, no organomegaly  Extremities:   Extremities normal, atraumatic, no cyanosis or edema  Pulses:   2+ and symmetric all extremities  Skin:   Skin color, texture, turgor normal, no rashes or lesions  Lymph nodes:   Cervical, supraclavicular, and axillary nodes normal  Neurologic:   CNII-XII intact, normal strength, sensation and reflexes    throughout    Review of image: Erythema, raised welts, excoriation No mottling   Assessment & Plan:   Problem List Items Addressed This Visit    None    Visit Diagnoses    Health maintenance examination    -  Primary Women's Health Maintenance Plan Advised monthly breast exam and annual mammogram Advised dental exam every six months Discussed stress management Discussed pap smear screening guidelines     Relevant Orders   Allergens, Zone 2   Urticaria       Relevant Orders   Ambulatory referral to Allergy   CMP14+EGFR   Food Allergy Profile   Allergens, Zone 2   Screening, lipid       Relevant Orders   Lipid panel   Screening, anemia, deficiency, iron       Relevant Orders   CBC      Outpatient Encounter Medications as of 07/11/2019  Medication Sig  . cetirizine (ZYRTEC) 10 MG chewable tablet Chew 10 mg by mouth daily.  Marland Kitchen  levonorgestrel (MIRENA) 20 MCG/24HR IUD 1 each by Intrauterine route once.   No facility-administered encounter medications on file as of 07/11/2019.     Follow-up: No follow-ups on file.   Forrest Moron, MD

## 2019-07-12 LAB — CBC
Hematocrit: 37.5 % (ref 34.0–46.6)
Hemoglobin: 12.5 g/dL (ref 11.1–15.9)
MCH: 28.9 pg (ref 26.6–33.0)
MCHC: 33.3 g/dL (ref 31.5–35.7)
MCV: 87 fL (ref 79–97)
Platelets: 306 10*3/uL (ref 150–450)
RBC: 4.33 x10E6/uL (ref 3.77–5.28)
RDW: 12.3 % (ref 11.7–15.4)
WBC: 14.6 10*3/uL — ABNORMAL HIGH (ref 3.4–10.8)

## 2019-07-13 LAB — CMP14+EGFR
ALT: 13 IU/L (ref 0–32)
AST: 15 IU/L (ref 0–40)
Albumin/Globulin Ratio: 1.7 (ref 1.2–2.2)
Albumin: 4.6 g/dL (ref 3.9–5.0)
Alkaline Phosphatase: 68 IU/L (ref 39–117)
BUN/Creatinine Ratio: 13 (ref 9–23)
BUN: 8 mg/dL (ref 6–20)
Bilirubin Total: 0.5 mg/dL (ref 0.0–1.2)
CO2: 23 mmol/L (ref 20–29)
Calcium: 9.6 mg/dL (ref 8.7–10.2)
Chloride: 102 mmol/L (ref 96–106)
Creatinine, Ser: 0.62 mg/dL (ref 0.57–1.00)
GFR calc Af Amer: 142 mL/min/{1.73_m2} (ref >59)
GFR calc non Af Amer: 123 mL/min/{1.73_m2} (ref >59)
Globulin, Total: 2.7 g/dL (ref 1.5–4.5)
Glucose: 69 mg/dL (ref 65–99)
Potassium: 4.1 mmol/L (ref 3.5–5.2)
Sodium: 141 mmol/L (ref 134–144)
Total Protein: 7.3 g/dL (ref 6.0–8.5)

## 2019-07-13 LAB — FOOD ALLERGY PROFILE
Allergen Corn, IgE: 0.26 kU/L — AB
Clam IgE: <0.1 kU/L
Codfish IgE: <0.1 kU/L
Egg White IgE: 0.14 kU/L — AB
Milk IgE: <0.1 kU/L
Peanut IgE: <0.1 kU/L
Scallop IgE: <0.1 kU/L
Sesame Seed IgE: <0.1 kU/L
Shrimp IgE: 1.4 kU/L — AB
Soybean IgE: <0.1 kU/L
Walnut IgE: <0.1 kU/L
Wheat IgE: 0.2 kU/L — AB

## 2019-07-13 LAB — ALLERGENS, ZONE 2
Alternaria Alternata IgE: 14.1 kU/L — AB
Amer Sycamore IgE Qn: <0.1 kU/L
Aspergillus Fumigatus IgE: 1.53 kU/L — AB
Bahia Grass IgE: <0.1 kU/L
Bermuda Grass IgE: 0.3 kU/L — AB
Cat Dander IgE: <0.1 kU/L
Cedar, Mountain IgE: 0.32 kU/L — AB
Cladosporium Herbarum IgE: 2.04 kU/L — AB
Cockroach, American IgE: 1.15 kU/L — AB
Common Silver Birch IgE: <0.1 kU/L
D Farinae IgE: 1.17 kU/L — AB
D Pteronyssinus IgE: 1.4 kU/L — AB
Dog Dander IgE: 0.34 kU/L — AB
Elm, American IgE: 0.15 kU/L — AB
Hickory, White IgE: <0.1 kU/L
Johnson Grass IgE: 0.14 kU/L — AB
Maple/Box Elder IgE: <0.1 kU/L
Mucor Racemosus IgE: 0.13 kU/L — AB
Mugwort IgE Qn: 0.16 kU/L — AB
Nettle IgE: 0.68 kU/L — AB
Oak, White IgE: <0.1 kU/L
Penicillium Chrysogen IgE: 0.38 kU/L — AB
Pigweed, Rough IgE: <0.1 kU/L
Plantain, English IgE: 0.17 kU/L — AB
Ragweed, Short IgE: 0.43 kU/L — AB
Sheep Sorrel IgE Qn: <0.1 kU/L
Stemphylium Herbarum IgE: 4.45 kU/L — AB
Sweet gum IgE RAST Ql: <0.1 kU/L
Timothy Grass IgE: 0.11 kU/L — AB
White Mulberry IgE: <0.1 kU/L

## 2019-07-13 LAB — LIPID PANEL
Chol/HDL Ratio: 4.7 ratio — ABNORMAL HIGH (ref 0.0–4.4)
Cholesterol, Total: 212 mg/dL — ABNORMAL HIGH (ref 100–199)
HDL: 45 mg/dL (ref >39)
LDL Chol Calc (NIH): 151 mg/dL — ABNORMAL HIGH (ref 0–99)
Triglycerides: 87 mg/dL (ref 0–149)
VLDL Cholesterol Cal: 16 mg/dL (ref 5–40)

## 2019-07-24 DIAGNOSIS — Z30431 Encounter for routine checking of intrauterine contraceptive device: Secondary | ICD-10-CM | POA: Diagnosis not present

## 2019-07-24 DIAGNOSIS — Z6834 Body mass index (BMI) 34.0-34.9, adult: Secondary | ICD-10-CM | POA: Diagnosis not present

## 2019-07-24 DIAGNOSIS — Z124 Encounter for screening for malignant neoplasm of cervix: Secondary | ICD-10-CM | POA: Diagnosis not present

## 2019-07-24 DIAGNOSIS — Z01419 Encounter for gynecological examination (general) (routine) without abnormal findings: Secondary | ICD-10-CM | POA: Diagnosis not present

## 2019-07-24 DIAGNOSIS — Z319 Encounter for procreative management, unspecified: Secondary | ICD-10-CM | POA: Diagnosis not present

## 2019-07-24 DIAGNOSIS — Z1389 Encounter for screening for other disorder: Secondary | ICD-10-CM | POA: Diagnosis not present

## 2019-07-25 DIAGNOSIS — Z124 Encounter for screening for malignant neoplasm of cervix: Secondary | ICD-10-CM | POA: Diagnosis not present

## 2019-07-30 ENCOUNTER — Encounter: Payer: Self-pay | Admitting: Family Medicine

## 2019-07-30 ENCOUNTER — Telehealth: Payer: Self-pay | Admitting: Physician Assistant

## 2019-07-30 DIAGNOSIS — R300 Dysuria: Secondary | ICD-10-CM

## 2019-07-30 DIAGNOSIS — N898 Other specified noninflammatory disorders of vagina: Secondary | ICD-10-CM

## 2019-07-30 NOTE — Progress Notes (Signed)
Hi Yani,  I'm sorry you aren't feeling well.  The details you provided in your questionnaire describe several different potential problems.  I do not feel comfortable treating you via e-visit today, as I could possibly treat you inappropriately.  I would prefer you make an appointment to see a medical provider face-to-face for some lab work.  I see you reached out to Dr. Nolon Rod this morning - if you cannot be worked in to her office, please see below for urgent care locations.    Based on what you shared with me, I feel your condition warrants further evaluation and I recommend that you be seen for a face to face office visit.  NOTE: If you entered your credit card information for this eVisit, you will not be charged. You may see a "hold" on your card for the $35 but that hold will drop off and you will not have a charge processed.  If you are having a true medical emergency please call 911.     For an urgent face to face visit, [facility] has four urgent care centers for your convenience:    NEW:  [facility] [place], [place] [address] .  Monday - Friday 10 am - 6 pm    . [facility]    [phone]                  Get Driving Directions  [address] [place], [place] [address] . 10 am to 8 pm Monday-Friday . 12 pm to 8 pm Saturday-Sunday   . [facility] [place]                  Get Driving Directions  [address], [place], [place] [address] . 8 am to 8 pm Monday-Friday . 9 am to 6 pm Saturday . 11 am to 6 pm Sunday     . [facility] [place]                  Get Driving Directions   [address] [place], [place] [address] . 8 am to 8 pm Monday-Friday . 8 am to 4 pm Saturday-Sunday    . [facility] [place]  [address] [place], [place] [address]  . Monday-Friday, 12 PM to 6 PM    Your e-visit answers were reviewed by a board certified advanced clinical practitioner to complete your personal care plan.  Thank you for using e-Visits.

## 2019-07-31 ENCOUNTER — Other Ambulatory Visit: Payer: Self-pay | Admitting: Family Medicine

## 2019-07-31 DIAGNOSIS — N898 Other specified noninflammatory disorders of vagina: Secondary | ICD-10-CM

## 2019-08-01 ENCOUNTER — Encounter: Payer: Self-pay | Admitting: Family Medicine

## 2019-08-01 ENCOUNTER — Telehealth (INDEPENDENT_AMBULATORY_CARE_PROVIDER_SITE_OTHER): Payer: 59 | Admitting: Family Medicine

## 2019-08-01 ENCOUNTER — Other Ambulatory Visit: Payer: Self-pay

## 2019-08-01 VITALS — Ht 64.0 in | Wt 195.0 lb

## 2019-08-01 DIAGNOSIS — N898 Other specified noninflammatory disorders of vagina: Secondary | ICD-10-CM | POA: Diagnosis not present

## 2019-08-01 NOTE — Progress Notes (Signed)
Telemedicine Encounter- SOAP NOTE Established Patient  This telephone encounter was conducted with the patient's (or proxy's) verbal consent via audio telecommunications: yes/no: Yes Patient was instructed to have this encounter in a suitably private space; and to only have persons present to whom they give permission to participate. In addition, patient identity was confirmed by use of name plus two identifiers (DOB and address).  I discussed the limitations, risks, security and privacy concerns of performing an evaluation and management service by telephone and the availability of in person appointments. I also discussed with the patient that there may be a patient responsible charge related to this service. The patient expressed understanding and agreed to proceed.  I spent a total of TIME; 0 MIN TO 60 MIN: 15 minutes talking with the patient or their proxy.  CC: vaginal irritation  Subjective   Crystal Simpson is a 28 y.o. established patient. Telephone visit today for  HPI   Onset 5 days ago Labial irritation Itchy without any pain with intercourse +dysuria +vaginal discharge Last intercourse, she does not use condoms, monogamous with husband Used a new soap  No fevers or chills No back pain No LMP recorded (lmp unknown). (Menstrual status: IUD).   Patient Active Problem List   Diagnosis Date Noted  . Intrauterine device 06/06/2018  . Dysfunctional uterine bleeding 06/06/2018  . Possible exposure to STD 06/06/2018    No past medical history on file.  Current Outpatient Medications  Medication Sig Dispense Refill  . cetirizine (ZYRTEC) 10 MG chewable tablet Chew 10 mg by mouth daily.    Marland Kitchen levonorgestrel (MIRENA) 20 MCG/24HR IUD 1 each by Intrauterine route once.     No current facility-administered medications for this visit.     No Known Allergies  Social History   Socioeconomic History  . Marital status: Married    Spouse name: Not on file  . Number of  children: Not on file  . Years of education: Not on file  . Highest education level: Not on file  Occupational History  . Occupation: nurse  Social Needs  . Financial resource strain: Not hard at all  . Food insecurity    Worry: Never true    Inability: Never true  . Transportation needs    Medical: No    Non-medical: No  Tobacco Use  . Smoking status: Never Smoker  . Smokeless tobacco: Never Used  Substance and Sexual Activity  . Alcohol use: Yes    Comment: occ  . Drug use: No  . Sexual activity: Yes    Birth control/protection: I.U.D.  Lifestyle  . Physical activity    Days per week: Not on file    Minutes per session: Not on file  . Stress: Not on file  Relationships  . Social Herbalist on phone: Not on file    Gets together: Not on file    Attends religious service: Not on file    Active member of club or organization: Not on file    Attends meetings of clubs or organizations: Not on file    Relationship status: Married  . Intimate partner violence    Fear of current or ex partner: No    Emotionally abused: No    Physically abused: No    Forced sexual activity: No  Other Topics Concern  . 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 The patient was provided an opportunity to ask questions and all were answered. The patient agreed with the plan and demonstrated an understanding of the instructions.   The patient was advised to call back or seek an in-person evaluation if the symptoms worsen or if the condition fails to improve as anticipated.  I provided 15 minutes of  non-face-to-face time during this encounter.  , MD  Primary [facility] at [facility]

## 2019-08-01 NOTE — Telephone Encounter (Signed)
Pt has telemed appt today

## 2019-08-01 NOTE — Progress Notes (Signed)
Possible yeast infection  Going on since Friday  Sx include- vaginal discomfort, itching, additional discharge.  Recently sexually active

## 2019-08-02 ENCOUNTER — Other Ambulatory Visit (HOSPITAL_COMMUNITY)
Admission: RE | Admit: 2019-08-02 | Discharge: 2019-08-02 | Disposition: A | Discharge status: Home / Self Care | Payer: Self-pay | Source: Ambulatory Visit | Attending: Family Medicine | Admitting: Family Medicine

## 2019-08-02 ENCOUNTER — Other Ambulatory Visit: Payer: Self-pay

## 2019-08-02 ENCOUNTER — Ambulatory Visit: Payer: 59

## 2019-08-02 DIAGNOSIS — N898 Other specified noninflammatory disorders of vagina: Secondary | ICD-10-CM | POA: Diagnosis not present

## 2019-08-03 LAB — GC/CHLAMYDIA PROBE AMP (~~LOC~~) NOT AT ARMC
Chlamydia: NEGATIVE
Comment: NEGATIVE
Comment: NORMAL
Neisseria Gonorrhea: NEGATIVE

## 2019-08-03 LAB — WET PREP FOR TRICH, YEAST, CLUE
Clue Cell Exam: NEGATIVE
Trichomonas Exam: NEGATIVE
Yeast Exam: NEGATIVE

## 2019-08-08 ENCOUNTER — Other Ambulatory Visit: Payer: Self-pay

## 2019-08-08 ENCOUNTER — Encounter: Payer: Self-pay | Admitting: Allergy

## 2019-08-08 ENCOUNTER — Ambulatory Visit: Payer: 59 | Admitting: Allergy

## 2019-08-08 VITALS — BP 108/64 | HR 82 | Temp 97.8°F | Resp 18 | Ht 64.5 in | Wt 198.0 lb

## 2019-08-08 DIAGNOSIS — H1013 Acute atopic conjunctivitis, bilateral: Secondary | ICD-10-CM

## 2019-08-08 DIAGNOSIS — J3089 Other allergic rhinitis: Secondary | ICD-10-CM

## 2019-08-08 DIAGNOSIS — L508 Other urticaria: Secondary | ICD-10-CM | POA: Diagnosis not present

## 2019-08-08 MED ORDER — FAMOTIDINE 20 MG PO TABS: 20.0000 mg | ORAL_TABLET | Freq: Two times a day (BID) | ORAL | 5 refills | Status: DC

## 2019-08-08 MED ORDER — CETIRIZINE HCL 10 MG PO TABS: 10.0000 mg | ORAL_TABLET | Freq: Two times a day (BID) | ORAL | 5 refills | Status: DC

## 2019-08-08 MED ORDER — MONTELUKAST SODIUM 10 MG PO TABS: 10.0000 mg | ORAL_TABLET | Freq: Every day | ORAL | 5 refills | Status: DC

## 2019-08-08 MED FILL — MONTELUKAST SOD 10 MG TAB: 10 | 30 days supply | Qty: 30 | Fill #0

## 2019-08-08 MED FILL — FAMOTIDINE 20 MG TABLET: 20 | 30 days supply | Qty: 60 | Fill #0

## 2019-08-08 NOTE — Progress Notes (Signed)
New Patient Note  RE: Crystal Simpson MRN: [id number] DOB: 07-23-1991 Date of Office Visit: 08/08/2019  Referring provider: Doristine Bosworth, MD Primary care provider: Doristine Bosworth, MD  Chief Complaint: hives  History of present illness: Crystal Simpson is a 28 y.o. female presenting today for consultation for hives.   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  She thought it was related to the stress.  However she states the hives continued to occur more frequently and worsen.    She states she can have daily hives if she does not take zyrtec.  Hives can be all over.  Hives last less than a day and do not leave any marks/bruising once resolved.  She has noted swelling of her fingers.  No joint aches/pains.  No preceding illness she can recall.  Denies new foods or medications, no stings or change in lotions/detergents/body products.    She takes zyrtec 1 tablet a day which she states does help and if she forgets to take zyrtec the hives do develop.    She did have testing by blood work done via PCP.  Environmental panel showed sensitivity to dust mites, dog dander, grasses, weeds, trees, cockroach.  Food allergy serum IgE testing was positive to egg, wheat, corn, shrimp.  She eats eggs at least several times a month and states she wasn't noticing any issues.  However does recall one day where she ate egg white and about 30 minutes later she has more hives.  She states she doesn't eat shrimp as she has a sibling with shellfish allergy.  She does eat wheat and corn products in diet and has not noted any worsening symptoms.    She moved to [place] area about 10 years ago and states in the last 5 years she notices symptoms of itchy eyes, ithcy throat and runny nose.  Zyrtec does help these symptoms.    No history of asthma, eczema or food allergy previously.   Review of systems: Review of Systems  Constitutional: Negative for chills, fever and  malaise/fatigue.  HENT: Negative for congestion, ear discharge, nosebleeds, sinus pain and sore throat.   Eyes: Negative for pain, discharge and redness.  Respiratory: Negative.   Cardiovascular: Negative.   Gastrointestinal: Negative.   Musculoskeletal: Negative.   Skin: Positive for itching and rash.  Neurological: Negative.     All other systems negative unless noted above in HPI  Past medical history: Past Medical History:  Diagnosis Date  . Urticaria     Past surgical history: Past Surgical History:  Procedure Laterality Date  . CESAREAN SECTION    . WISDOM TOOTH EXTRACTION      Family history:  Family History  Problem Relation Age of Onset  . Rheum arthritis Mother   . Eczema Mother   . Healthy Father   . Healthy Sister   . Healthy Brother   . Healthy Daughter   . Diabetes Maternal Grandmother   . Hypertension Maternal Grandfather   . Parkinson's disease Paternal Grandmother     Social history: Lives in a home without carpeting with gas heating and central and fan heating.  No pets in the home.  No concern for water damage, mildew or roaches in the home.  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    Medication List: Current Outpatient Medications  Medication Sig Dispense Refill  . cetirizine (ZYRTEC) 10 MG chewable tablet Chew 10 mg by mouth daily.    Marland Kitchen  levonorgestrel (MIRENA) 20 MCG/24HR IUD 1 each by Intrauterine route once.    . cetirizine (ZYRTEC) 10 MG tablet Take 1 tablet (10 mg total) by mouth 2 (two) times daily. 60 tablet 5  . famotidine (PEPCID) 20 MG tablet Take 1 tablet (20 mg total) by mouth 2 (two) times daily. 60 tablet 5  . montelukast (SINGULAIR) 10 MG tablet Take 1 tablet (10 mg total) by mouth daily. 30 tablet 5   No current facility-administered medications for this visit.     Known medication allergies: No Known Allergies   Physical examination: Blood pressure 108/64, pulse 82, temperature 97.8 F (36.6 C), temperature source  Temporal, resp. rate 18, height 5' 4.5" (1.638 m), weight 198 lb (89.8 kg), SpO2 98 %.  General: Alert, interactive, in no acute distress. HEENT: PERRLA, TMs pearly gray, turbinates non-edematous without discharge, post-pharynx non erythematous. Neck: Supple without lymphadenopathy. Lungs: Clear to auscultation without wheezing, rhonchi or rales. {no increased work of breathing. CV: Normal S1, S2 without murmurs. Abdomen: Nondistended, nontender. Skin: Scattered erythematous urticarial type lesions primarily located chest, neck, arms b/l , nonvesicular. Extremities:  No clubbing, cyanosis or edema. Neuro:   Grossly intact.  Diagnositics/Labs: Labs:  Component     Latest Ref Rng & Units 07/11/2019 07/11/2019 07/11/2019         3:24 PM  3:26 PM  4:05 PM  D Pteronyssinus IgE     Class II kU/L  1.40 (A)   D Farinae IgE     Class II kU/L  1.17 (A)   Cat Dander IgE     Class 0 kU/L  <0.10   Dog Dander IgE     Class I kU/L  0.34 (A)   [place] Grass IgE     Class 0/I kU/L  0.30 (A)   Timothy Grass IgE     Class 0/I kU/L  0.11 (A)   [place] Grass IgE     Class 0/I kU/L  0.14 (A)   Bahia Grass IgE     Class 0 kU/L  <0.10   Cockroach, American IgE     Class II kU/L  1.15 (A)   Penicillium Chrysogen IgE     Class I kU/L  0.38 (A)   Cladosporium Herbarum IgE     Class III kU/L  2.04 (A)   Aspergillus Fumigatus IgE     Class III kU/L  1.53 (A)   Mucor Racemosus IgE     Class 0/I kU/L  0.13 (A)   Alternaria Alternata IgE     Class IV kU/L  14.10 (A)   Stemphylium Herbarum IgE     Class IV kU/L  4.45 (A)   Common Silver Charletta Cousin IgE     Class 0 kU/L  <0.10   Oak, White IgE     Class 0 kU/L  <0.10   Elm, American IgE     Class 0/I kU/L  0.15 (A)   Maple/Box Elder IgE     Class 0 kU/L  <0.10   Hickory, White IgE     Class 0 kU/L  <0.10   Amer Sycamore IgE Qn     Class 0 kU/L  <0.10   White Mulberry IgE     Class 0 kU/L  <0.10   Sweet gum IgE RAST Ql     Class 0 kU/L  <0.10    Cedar, [place] IgE     Class I kU/L  0.32 (A)   Ragweed, Short IgE     Class  I kU/L  0.43 (A)   Mugwort IgE Qn     Class 0/I kU/L  0.16 (A)   Plantain, English IgE     Class 0/I kU/L  0.17 (A)   Pigweed, Rough IgE     Class 0 kU/L  <0.10   Sheep Sorrel IgE Qn     Class 0 kU/L  <0.10   Nettle IgE     Class II kU/L  0.68 (A)   Glucose     65 - 99 mg/dL 69    BUN     6 - 20 mg/dL 8    Creatinine     1.610.57 - 1.00 mg/dL 0.960.62    GFR, Est Non African American     >59 mL/min/1.73 123    GFR, Est African American     >59 mL/min/1.73 142    BUN/Creatinine Ratio     9 - 23 13    Sodium     134 - 144 mmol/L 141    Potassium     3.5 - 5.2 mmol/L 4.1    Chloride     96 - 106 mmol/L 102    CO2     20 - 29 mmol/L 23    Calcium     8.7 - 10.2 mg/dL 9.6    Total Protein     6.0 - 8.5 g/dL 7.3    Albumin     3.9 - 5.0 g/dL 4.6    Globulin, Total     1.5 - 4.5 g/dL 2.7    Albumin/Globulin Ratio     1.2 - 2.2 1.7    Total Bilirubin     0.0 - 1.2 mg/dL 0.5    Alkaline Phosphatase     39 - 117 IU/L 68    AST     0 - 40 IU/L 15    ALT     0 - 32 IU/L 13    Egg White IgE     Class 0/I kU/L 0.14 (A)    Peanut IgE     Class 0 kU/L <0.10    Soybean IgE     Class 0 kU/L <0.10    Milk IgE     Class 0 kU/L <0.10    Clam IgE     Class 0 kU/L <0.10    Shrimp IgE     Class II kU/L 1.40 (A)    Walnut IgE     Class 0 kU/L <0.10    Codfish IgE     Class 0 kU/L <0.10    Scallop IgE     Class 0 kU/L <0.10    Wheat IgE     Class 0/I kU/L 0.20 (A)    Allergen Corn, IgE     Class 0/I kU/L 0.26 (A)    Sesame Seed IgE     Class 0 kU/L <0.10    WBC     3.4 - 10.8 x10E3/uL   14.6 (H)  RBC     3.77 - 5.28 x10E6/uL   4.33  Hemoglobin     11.1 - 15.9 g/dL   04.512.5  HCT     40.934.0 - 46.6 %   37.5  MCV     79 - 97 fL   87  MCH     26.6 - 33.0 pg   28.9  MCHC     31.5 - 35.7 g/dL   81.133.3  RDW     91.411.7 - 15.4 %  12.3  Platelets     150 - 450 x10E3/uL   306   Allergy testing:  deferred due to ongoing urticaria  Assessment and plan:   Chronic urticaria  - at this time etiology of hives and swelling is unknown.  Hives can be caused by a variety of different triggers including illness/infection, foods, medications, stings, exercise, pressure, vibrations, extremes of temperature to name a few however majority of the time there is no identifiable trigger.  Your symptoms have been ongoing for >6 weeks making this chronic thus will obtain labwork to evaluate: CBC w diff, tryptase, hive panel, alpha-gal panel, inflammatory markers, ANA (rheumatoid marker).   - with positive IgE to egg, wheat, shrimp and corn at this time would avoid in the diet.  It is less likely to be triggering hives as you have daily hives and have a varied diet.  However until hives are under better control would recommend avoidance until able to challenge to ensure not allergic.    - for management of hives recommend following high-dose antihistamine regimen: Zyrtec 10mg  1 tablet twice a day, Pepcid 20mg  1 tablet twice a day and Singulair 10mg  daily at bedtime.  If you note any change in mood or behavior after starting Singulair then stop this medication and let us know (symptoms return to baseline after stopping).    - Xolair monthly injections discussed today including protocol, benefits/risks discussed today.  Xolair is a step-up therapy if high-dose antihistamines is not effective enough in management symptoms.    Allergic rhinitis with conjunctivitis  - environmental allergy panel shows sensitivity to dust mites, dog dander, molds, grass pollens, tree pollens, weed pollens, cockroach  - continue Zyrtec daily as needed for control of allergy symptoms  - for itchy/watery eyes can use OTC Pataday 1 drop each eye daily as needed  - for nasal drainage or congestion can use OTC Flonase, Rhinocort or Nasacort 2 sprays daily as needed.    Follow-up 6-8 weeks or sooner if needed  I appreciate the opportunity  to take part in Crystal Simpson's care. Please do not hesitate to contact me with questions.  Sincerely,   Prudy Feeler, MD Allergy/Immunology [facility] [place] [facility]

## 2019-08-08 NOTE — Patient Instructions (Addendum)
Chronic hives  - at this time etiology of hives and swelling is unknown.  Hives can be caused by a variety of different triggers including illness/infection, foods, medications, stings, exercise, pressure, vibrations, extremes of temperature to name a few however majority of the time there is no identifiable trigger.  Your symptoms have been ongoing for >6 weeks making this chronic thus will obtain labwork to evaluate: CBC w diff, tryptase, hive panel, alpha-gal panel, inflammatory markers, ANA (rheumatoid marker).   - with positive IgE to egg, wheat, shrimp and corn at this time would avoid in the diet.  It is less likely to be triggering hives as you have daily hives and have a varied diet.  However until hives are under better control would recommend avoidance until able to challenge to ensure not allergic.    - for management of hives recommend following high-dose antihistamine regimen: Zyrtec 10mg  1 tablet twice a day, Pepcid 20mg  1 tablet twice a day and Singulair 10mg  daily at bedtime.  If you note any change in mood or behavior after starting Singulair then stop this medication and let us know (symptoms return to baseline after stopping).    - Xolair monthly injections discussed today including protocol, benefits/risks discussed today.  Xolair is a step-up therapy if high-dose antihistamines is not effective enough in management symptoms.    Allergic rhinitis with conjunctivitis  - environmental allergy panel shows sensitivity to dust mites, dog dander, molds, grass pollens, tree pollens, weed pollens, cockroach  - continue Zyrtec daily as needed for control of allergy symptoms  - for itchy/watery eyes can use OTC Pataday 1 drop each eye daily as needed  - for nasal drainage or congestion can use OTC Flonase, Rhinocort or Nasacort 2 sprays daily as needed.    Follow-up 6-8 weeks or sooner if needed

## 2019-08-15 LAB — CBC WITH DIFFERENTIAL/PLATELET
Basophils Absolute: 0.1 10*3/uL (ref 0.0–0.2)
Basos: 0 %
EOS (ABSOLUTE): 0.3 10*3/uL (ref 0.0–0.4)
Eos: 2 %
Hematocrit: 36.3 % (ref 34.0–46.6)
Hemoglobin: 11.9 g/dL (ref 11.1–15.9)
Immature Grans (Abs): 0.1 10*3/uL (ref 0.0–0.1)
Immature Granulocytes: 1 %
Lymphocytes Absolute: 3.4 10*3/uL — ABNORMAL HIGH (ref 0.7–3.1)
Lymphs: 26 %
MCH: 29 pg (ref 26.6–33.0)
MCHC: 32.8 g/dL (ref 31.5–35.7)
MCV: 89 fL (ref 79–97)
Monocytes Absolute: 1.3 10*3/uL — ABNORMAL HIGH (ref 0.1–0.9)
Monocytes: 10 %
Neutrophils Absolute: 8 10*3/uL — ABNORMAL HIGH (ref 1.4–7.0)
Neutrophils: 61 %
Platelets: 321 10*3/uL (ref 150–450)
RBC: 4.1 x10E6/uL (ref 3.77–5.28)
RDW: 12.1 % (ref 11.7–15.4)
WBC: 13.1 10*3/uL — ABNORMAL HIGH (ref 3.4–10.8)

## 2019-08-15 LAB — ALPHA-GAL PANEL
Alpha Gal IgE*: <0.1 kU/L (ref <0.10)
Beef (Bos spp) IgE: <0.1 kU/L (ref <0.35)
Class Interpretation: 0
Class Interpretation: 0
Class Interpretation: 0
Lamb/Mutton (Ovis spp) IgE: <0.1 kU/L (ref <0.35)
Pork (Sus spp) IgE: <0.1 kU/L (ref <0.35)

## 2019-08-15 LAB — TRYPTASE: Tryptase: 5.3 ug/L (ref 2.2–13.2)

## 2019-08-15 LAB — CHRONIC URTICARIA: cu index: 3.4 (ref <10)

## 2019-08-15 LAB — SEDIMENTATION RATE: Sed Rate: 36 mm/hr — ABNORMAL HIGH (ref 0–32)

## 2019-08-15 LAB — THYROID ANTIBODIES
Thyroglobulin Antibody: <1 IU/mL (ref 0.0–0.9)
Thyroperoxidase Ab SerPl-aCnc: <9 IU/mL (ref 0–34)

## 2019-08-15 LAB — ANA W/REFLEX: Anti Nuclear Antibody (ANA): NEGATIVE

## 2019-09-03 DIAGNOSIS — H5203 Hypermetropia, bilateral: Secondary | ICD-10-CM | POA: Diagnosis not present

## 2019-09-03 DIAGNOSIS — H52223 Regular astigmatism, bilateral: Secondary | ICD-10-CM | POA: Diagnosis not present

## 2019-09-27 ENCOUNTER — Telehealth: Payer: 59 | Admitting: Emergency Medicine

## 2019-09-27 ENCOUNTER — Ambulatory Visit: Payer: Self-pay

## 2019-09-27 ENCOUNTER — Ambulatory Visit: Payer: 59 | Admitting: Allergy

## 2019-09-27 DIAGNOSIS — R21 Rash and other nonspecific skin eruption: Secondary | ICD-10-CM

## 2019-09-27 MED ORDER — HYDROCORTISONE 1 % EX CREA: 1.0000 "application " | TOPICAL_CREAM | Freq: Two times a day (BID) | CUTANEOUS | 0 refills | Status: DC

## 2019-09-27 NOTE — Progress Notes (Signed)
E Visit for Rash  We are sorry that you are not feeling well. Here is how we plan to help!  I appreciate the pictures that you sent.  Unfortunately even with the pictures, I am unable to tell exactly what is causing your rash.  In general, rashes respond to either steroids or anti-fungal creams.    In your case, I recommend that you use hydrocortisone cream on the skin eruptions for the next week.  If your symptoms improve, continue use for 1 additional week for a total of 2 weeks.  I have sent a prescription to your pharmacy, but it is also available over the counter.  If you do not notice any improvement, I recommend that you follow-up with a dermatologist, who may need to take skin scrapings or biopsies in order to diagnose your rash.    HOME CARE:   Take cool showers and avoid direct sunlight.  Apply cool compress or wet dressings.  Take a bath in an oatmeal bath.  Sprinkle content of one Aveeno packet under running faucet with comfortably warm water.  Bathe for 15-20 minutes, 1-2 times daily.  Pat dry with a towel. Do not rub the rash.  Use hydrocortisone cream.  Take an antihistamine like Benadryl for widespread rashes that itch.  The adult dose of Benadryl is 25-50 mg by mouth 4 times daily.  Caution:  This type of medication may cause sleepiness.  Do not drink alcohol, drive, or operate dangerous machinery while taking antihistamines.  Do not take these medications if you have prostate enlargement.  Read package instructions thoroughly on all medications that you take.  GET HELP RIGHT AWAY IF:   Symptoms don't go away after treatment.  Severe itching that persists.  If you rash spreads or swells.  If you rash begins to smell.  If it blisters and opens or develops a yellow-brown crust.  You develop a fever.  You have a sore throat.  You become short of breath.  MAKE SURE YOU:  Understand these instructions. Will watch your condition. Will get help right  away if you are not doing well or get worse.  Thank you for choosing an e-visit. Your e-visit answers were reviewed by a board certified advanced clinical practitioner to complete your personal care plan. Depending upon the condition, your plan could have included both over the counter or prescription medications. Please review your pharmacy choice. Be sure that the pharmacy you have chosen is open so that you can pick up your prescription now.  If there is a problem you may message your provider in [place] to have the prescription routed to another pharmacy. Your safety is important to [place]. If you have drug allergies check your prescription carefully.  For the next 24 hours, you can use MyChart to ask questions about today's visit, request a non-urgent call back, or ask for a work or school excuse from your e-visit provider. You will get an email in the next two days asking about your experience. I hope that your e-visit has been valuable and will speed your recovery.    Approximately 5 minutes was used in reviewing the patient's chart, questionnaire, prescribing medications, and documentation.

## 2019-09-27 NOTE — Telephone Encounter (Signed)
Patient called for more advice.  She had an E visit today with a provider for a widespread rash over her body.  She states that it is raised and vary in size. She states it is on her torso arms and legs.  She rates itching at 3. She states she takes medication for spontaneous hives. She states she has 2 new dogs in her home. Care advice read to patient. She will follow advice of E-visit doctor given today. She will seek medical care if symptoms progress. She verbalized understanding of all information.  Reason for Disposition . Mild widespread rash  Answer Assessment - Initial Assessment Questions 1. APPEARANCE of RASH: "Describe the rash." (e.g., spots, blisters, raised areas, skin peeling, scaly)     different flat slightly raised 2. SIZE: "How big are the spots?" (e.g., tip of pen, eraser, coin; inches, centimeters)    multible sizes 3. LOCATION: "Where is the rash located?"     Stomach back arms and legs 4. COLOR: "What color is the rash?" (Note: It is difficult to assess rash color in people with darker-colored skin. When this situation occurs, simply ask the caller to describe what they see.)     red 5. ONSET: "When did the rash begin?"     2 weeks ago 6. FEVER: "Do you have a fever?" If so, ask: "What is your temperature, how was it measured, and when did it start?"     no 7. ITCHING: "Does the rash itch?" If so, ask: "How bad is the itch?" (Scale 1-10; or mild, moderate, severe)    3 8. CAUSE: "What do you think is causing the rash?"   2 dogs 9. MEDICATION FACTORS: "Have you started any new medications within the last 2 weeks?" (e.g., antibiotics)     No just started medications for spontaneous hives 10. OTHER SYMPTOMS: "Do you have any other symptoms?" (e.g., dizziness, headache, sore throat, joint pain)       no 11. PREGNANCY: "Is there any chance you are pregnant?" "When was your last menstrual period?"      No myrana  Protocols used: RASH OR REDNESS - [facility]

## 2019-10-10 ENCOUNTER — Other Ambulatory Visit: Payer: Self-pay

## 2019-10-10 ENCOUNTER — Encounter: Payer: Self-pay | Admitting: Family Medicine

## 2019-10-10 ENCOUNTER — Ambulatory Visit: Payer: 59 | Admitting: Family Medicine

## 2019-10-10 VITALS — BP 111/75 | HR 74 | Temp 98.0°F | Resp 17 | Ht 64.5 in | Wt 191.8 lb

## 2019-10-10 DIAGNOSIS — L42 Pityriasis rosea: Secondary | ICD-10-CM

## 2019-10-10 MED ORDER — TRIAMCINOLONE ACETONIDE 0.1 % EX CREA: 1.0000 "application " | TOPICAL_CREAM | Freq: Two times a day (BID) | CUTANEOUS | 0 refills | Status: DC

## 2019-10-10 MED FILL — MONTELUKAST SOD 10 MG TAB: 10 | 30 days supply | Qty: 30 | Fill #1

## 2019-10-10 MED FILL — FAMOTIDINE 20 MG TABS: 20 | 30 days supply | Qty: 60 | Fill #1

## 2019-10-10 MED FILL — TRIAMCINOLONE 0.1% CREAM: 0.1 | 7 days supply | Qty: 30 | Fill #0

## 2019-10-10 NOTE — Progress Notes (Signed)
Established Patient Office Visit  Subjective:  Patient ID: Crystal Simpson, female    DOB: [birth year]  Age: 29 y.o. MRN: [id number]  CC:  Chief Complaint  Patient presents with  . Rash    torso, back,arms and legs x few weeks.  Tried otc hydrocortisone x 1 week with no relief    HPI Crystal Simpson presents for   3 weeks history of itchy patchy rash that started on one sport on the arm and spread over the torso  Her son had something similar Hydrocortison is not helping Her son had an episode similar a year ago  No recent exposure to anyone with a rash    Past Medical History:  Diagnosis Date  . Urticaria     Past Surgical History:  Procedure Laterality Date  . CESAREAN SECTION    . WISDOM TOOTH EXTRACTION      Family History  Problem Relation Age of Onset  . Rheum arthritis Mother   . Eczema Mother   . Healthy Father   . Healthy Sister   . Healthy Brother   . Healthy Daughter   . Diabetes Maternal Grandmother   . Hypertension Maternal Grandfather   . Parkinson's disease Paternal Grandmother     Social History   Socioeconomic History  . Marital status: Married    Spouse name: Not on file  . Number of children: Not on file  . Years of education: Not on file  . Highest education level: Not on file  Occupational History  . Occupation: nurse  Tobacco Use  . Smoking status: Never Smoker  . Smokeless tobacco: Never Used  Substance and Sexual Activity  . Alcohol use: Yes    Comment: occ  . Drug use: No  . Sexual activity: Yes    Birth control/protection: I.U.D.  Other Topics Concern  . Not on file  Social History Narrative   Nurse for [employer]   Social Determinants of Health   Financial Resource Strain: Low Risk   . Difficulty of Paying Living Expenses: Not hard at all  Food Insecurity: No Food Insecurity  . Worried About Charity fundraiser in the Last Year: Never true  . Ran Out of Food in the Last Year: Never true  Transportation Needs: No  Transportation Needs  . Lack of Transportation (Medical): No  . Lack of Transportation (Non-Medical): No  Physical Activity:   . Days of Exercise per Week: Not on file  . Minutes of Exercise per Session: Not on file  Stress:   . Feeling of Stress : Not on file  Social Connections: Unknown  . Frequency of Communication with Friends and Family: Not on file  . Frequency of Social Gatherings with Friends and Family: Not on file  . Attends Religious Services: Not on file  . Active Member of Clubs or Organizations: Not on file  . Attends Archivist Meetings: Not on file  . Marital Status: Married  Human resources officer Violence: Not At Risk  . Fear of Current or Ex-Partner: No  . Emotionally Abused: No  . Physically Abused: No  . Sexually Abused: No    Outpatient Medications Prior to Visit  Medication Sig Dispense Refill  . cetirizine (ZYRTEC) 10 MG chewable tablet Chew 10 mg by mouth daily.    . cetirizine (ZYRTEC) 10 MG tablet Take 1 tablet (10 mg total) by mouth 2 (two) times daily. 60 tablet 5  . famotidine (PEPCID) 20 MG tablet Take 1 tablet (20 mg total) by  mouth 2 (two) times daily. 60 tablet 5  . hydrocortisone cream 1 % Apply 1 application topically 2 (two) times daily. 30 g 0  . levonorgestrel (MIRENA) 20 MCG/24HR IUD 1 each by Intrauterine route once.    . montelukast (SINGULAIR) 10 MG tablet Take 1 tablet (10 mg total) by mouth daily. 30 tablet 5   No facility-administered medications prior to visit.    No Known Allergies  ROS Review of Systems    Objective:    Physical Exam  BP 111/75 (BP Location: Right Arm, Patient Position: Sitting, Cuff Size: Normal)   Pulse 74   Temp 98 F (36.7 C) (Oral)   Resp 17   Ht 5' 4.5" (1.638 m)   Wt 191 lb 12.8 oz (87 kg)   SpO2 95%   BMI 32.41 kg/m  Wt Readings from Last 3 Encounters:  10/10/19 191 lb 12.8 oz (87 kg)  08/08/19 198 lb (89.8 kg)  08/01/19 195 lb (88.5 kg)     Health Maintenance Due  Topic Date  Due  . TETANUS/TDAP  08/21/2009  . PAP-Cervical Cytology Screening  08/22/2011  . PAP SMEAR-Modifier  08/22/2011    There are no preventive care reminders to display for this patient.  No results found for: TSH Lab Results  Component Value Date   WBC 13.1 (H) 08/08/2019   HGB 11.9 08/08/2019   HCT 36.3 08/08/2019   MCV 89 08/08/2019   PLT 321 08/08/2019   Lab Results  Component Value Date   NA 141 07/11/2019   K 4.1 07/11/2019   CO2 23 07/11/2019   GLUCOSE 69 07/11/2019   BUN 8 07/11/2019   CREATININE 0.62 07/11/2019   BILITOT 0.5 07/11/2019   ALKPHOS 68 07/11/2019   AST 15 07/11/2019   ALT 13 07/11/2019   PROT 7.3 07/11/2019   ALBUMIN 4.6 07/11/2019   CALCIUM 9.6 07/11/2019   ANIONGAP 9 11/01/2015   Lab Results  Component Value Date   CHOL 212 (H) 07/11/2019   Lab Results  Component Value Date   HDL 45 07/11/2019   Lab Results  Component Value Date   LDLCALC 151 (H) 07/11/2019   Lab Results  Component Value Date   TRIG 87 07/11/2019   Lab Results  Component Value Date   CHOLHDL 4.7 (H) 07/11/2019   No results found for: HGBA1C    Assessment & Plan:   Problem List Items Addressed This Visit    None    Visit Diagnoses    Pityriasis rosea    -  Primary     Discussed supportive care as this is self-limiting Will use a more potent steroid  Meds ordered this encounter  Medications  . triamcinolone cream (KENALOG) 0.1 %    Sig: Apply 1 application topically 2 (two) times daily.    Dispense:  30 g    Refill:  0    Follow-up: No follow-ups on file.    Doristine Bosworth, MD

## 2019-10-10 NOTE — Patient Instructions (Addendum)
If you have lab work done today you will be contacted with your lab results within the next 2 weeks.  If you have not heard from [place] then please contact us. The fastest way to get your results is to register for My Chart.   IF you received an x-ray today, you will receive an invoice from [facility]. Please contact [facility] at [phone] with questions or concerns regarding your invoice.   IF you received labwork today, you will receive an invoice from [place] [facility]. Please contact [facility] at [phone] with questions or concerns regarding your invoice.   Our billing staff will not be able to assist you with questions regarding bills from these companies.  You will be contacted with the lab results as soon as they are available. The fastest way to get your results is to activate your My Chart account. Instructions are located on the last page of this paperwork. If you have not heard from [place] regarding the results in 2 weeks, please contact this office.     Pityriasis Rosea Pityriasis rosea is a rash that usually appears on the chest, abdomen, and back. It may also appear on the upper arms and upper legs. It usually begins as a single patch, and then more patches start to develop. The rash may cause mild itching, but it normally does not cause other problems. It usually goes away without treatment. However, it may take weeks or months for the rash to go away completely. What are the causes? The cause of this condition is not known. The condition does not spread from person to person (is not contagious). What increases the risk? This condition is more likely to develop in:  Persons aged 10-35 years.  Pregnant women. It is more common in the spring and fall seasons. What are the signs or symptoms? The main symptom of this condition is a rash.  The rash usually begins with a single oval patch that is larger than the ones that follow. This is called a herald patch. It  generally appears a week or more before the rest of the rash appears.  When more patches start to develop, they spread quickly on the chest, abdomen, back, arms, and legs. These patches are smaller than the first one.  The patches that make up the rash are usually oval-shaped and pink or red in color. They are usually flat but may sometimes be raised so that they can be felt with a finger. They may also be finely crinkled and have a scaly ring around the edge. Some people may have mild itching and nonspecific symptoms, such as:  Nausea.  Loss of appetite.  Difficulty concentrating.  Headache.  Irritability.  Sore throat.  Mild fever. How is this diagnosed? This condition may be diagnosed based on:  Your medical history and a physical exam.  Tests to rule out other causes. This may include blood tests or a test in which a small sample of skin is removed from the rash (biopsy) and checked in a lab. How is this treated?     Treatment is not usually needed for this condition. The rash will often go away on its own in 4-8 weeks. In some cases, a health care provider may recommend or prescribe medicine to reduce itching. Follow these instructions at home:  Take or apply over-the-counter and prescription medicines only as told by your health care provider.  Avoid scratching the affected areas of skin.  Do not take hot baths  or use a sauna. Use only warm water when bathing or showering. Heat can increase itching. Adding cornstarch to your bath may help to relieve the itching.  Avoid exposure to the sun and other sources of UV light, such as tanning beds, as told by your health care provider. UV light may help the rash go away but may cause unwanted changes in skin color.  Keep all follow-up visits as told by your health care provider. This is important. Contact a health care provider if:  Your rash does not go away in 8 weeks.  Your rash gets much worse.  You have a  fever.  You have swelling or pain in the rash area.  You have fluid, blood, or pus coming from the rash area. Summary  Pityriasis rosea is a rash that usually appears on the trunk of the body. It can also appear on the upper arms and upper legs.  The rash usually begins with a single oval patch (herald patch) that appears a week or more before the rest of the rash appears. The herald patch is larger than the ones that follow.  The rash may cause mild itching, but it usually does not cause other problems. It usually goes away without treatment in 4-8 weeks.  In some cases, a health care provider may recommend or prescribe medicine to reduce itching. This information is not intended to replace advice given to you by your health care provider. Make sure you discuss any questions you have with your health care provider. Document Revised: 09/12/2017 Document Reviewed: 09/12/2017 Elsevier Patient Education  2020 [employer].

## 2019-10-19 ENCOUNTER — Other Ambulatory Visit: Payer: Self-pay

## 2019-10-19 ENCOUNTER — Encounter: Payer: Self-pay | Admitting: Allergy

## 2019-10-19 ENCOUNTER — Ambulatory Visit (INDEPENDENT_AMBULATORY_CARE_PROVIDER_SITE_OTHER): Payer: 59 | Admitting: Allergy

## 2019-10-19 VITALS — BP 100/62 | HR 75 | Temp 97.9°F | Resp 16 | Ht 64.0 in | Wt 188.0 lb

## 2019-10-19 DIAGNOSIS — L508 Other urticaria: Secondary | ICD-10-CM | POA: Diagnosis not present

## 2019-10-19 DIAGNOSIS — J3089 Other allergic rhinitis: Secondary | ICD-10-CM | POA: Diagnosis not present

## 2019-10-19 DIAGNOSIS — H1013 Acute atopic conjunctivitis, bilateral: Secondary | ICD-10-CM | POA: Diagnosis not present

## 2019-10-19 NOTE — Patient Instructions (Addendum)
Chronic hives  - at this time etiology of hives and swelling is spontaneous/idiopathic.  Hives can be caused by a variety of different triggers including illness/infection, foods, medications, stings, exercise, pressure, vibrations, extremes of temperature to name a few however majority of the time there is no identifiable trigger. Labwork did not reveal any underlying causes  - with positive IgE to egg, wheat, shrimp and corn at this time would avoid in the diet.  It is less likely to be triggering hives as you have daily hives and have a varied diet.  However until hives are under better control and hopefully able to come off antihistamines would recommend avoidance until able to challenge to ensure not allergic.    - for hive control recommend use of high-dose antihistamine regimen:   Cetirizine (Zyrtec) 10mg  twice a day and famotidine 20 mg (Pepcid) twice a day and Singulair 10mg  daily. If no symptoms for 7 days then decrease to.  Cetirizine (Zyrtec) 10mg  twice a day and famotidine 20 mg (Pepcid) once a day and Singulair 10mg  daily.  If no symptoms for 7 days then decrease to.  Cetirizine (Zyrtec) 10mg  twice a day and Singulair 10mg  daily.  If no symptoms for 7 days then decrease to.  Cetirizine (Zyrtec) 10mg  once a day and Singulair 10mg  daily.  If no symptoms for 7 days then decrease to.  Singulair 10mg  daily.  If no symptoms for 7 days then stop Singulair and wean complete  May use Benadryl (diphenhydramine) as needed for breakthrough hives        If symptoms return, then step up dosage to whatever was last weaned and continue that regimen.  Let know how the wean goes  - Xolair monthly injections previously discussed and is a step-up therapy if high-dose antihistamines is not effective enough in management symptoms.    Allergic rhinitis with conjunctivitis  -continue avoidance measures for dust mites, dog dander, molds, grass pollens, tree pollens, weed pollens, cockroach  -  continue Zyrtec daily as needed for control of allergy symptoms  - for itchy/watery eyes can use OTC Pataday 1 drop each eye daily as needed  - for nasal drainage or congestion can use OTC Flonase, Rhinocort or Nasacort 2 sprays daily as needed.    Follow-up 3-4 months or sooner if needed

## 2019-10-19 NOTE — Progress Notes (Signed)
Follow-up Note  RE: Crystal Simpson MRN: [id number] DOB: 12/28/1990 Date of Office Visit: 10/19/2019   History of present illness: Crystal Simpson is a 29 y.o. female presenting today for follow-up of chronic urticaria and allergic rhinitis with conjunctivitis.  She was last seen in the office on 08/08/2019 by myself.  Since this visit she has been doing her high-dose antihistamine regimen of Zyrtec twice a day, Pepcid twice a day and singular daily.  She states since doing this she has not had any further hives.  She states with the addition of the antihistamines as well as Singulair that she has had improvement in her allergy symptoms. Currently she states she does have some itch related to being diagnosed with pityriasis and she does use triamcinolone for this.  She states she was diagnosed with this last week by her PCP.  She does feel that the antihistamines have helped curb the itching.  Review of systems: Review of Systems  Constitutional: Negative.   HENT: Negative.   Eyes: Negative.   Respiratory: Negative.   Cardiovascular: Negative.   Gastrointestinal: Negative.   Musculoskeletal: Negative.   Skin: Positive for itching and rash.  Neurological: Negative.     All other systems negative unless noted above in HPI  Past medical/social/surgical/family history have been reviewed and are unchanged unless specifically indicated below.  No changes  Medication List: Current Outpatient Medications  Medication Sig Dispense Refill  . cetirizine (ZYRTEC) 10 MG tablet Take 1 tablet (10 mg total) by mouth 2 (two) times daily. 60 tablet 5  . famotidine (PEPCID) 20 MG tablet Take 1 tablet (20 mg total) by mouth 2 (two) times daily. 60 tablet 5  . levonorgestrel (MIRENA) 20 MCG/24HR IUD 1 each by Intrauterine route once.    . montelukast (SINGULAIR) 10 MG tablet Take 1 tablet (10 mg total) by mouth daily. 30 tablet 5  . triamcinolone cream (KENALOG) 0.1 % Apply 1 application topically 2  (two) times daily. 300 g 0   No current facility-administered medications for this visit.     Known medication allergies: No Known Allergies   Physical examination: Blood pressure 100/62, pulse 75, temperature 97.9 F (36.6 C), temperature source Temporal, resp. rate 16, height 5\' 4"  (1.626 m), weight 188 lb (85.3 kg), SpO2 97 %.  General: Alert, interactive, in no acute distress. HEENT: PERRLA, TMs pearly gray, turbinates non-edematous without discharge, post-pharynx non erythematous. Neck: Supple without lymphadenopathy. Lungs: Clear to auscultation without wheezing, rhonchi or rales. {no increased work of breathing. CV: Normal S1, S2 without murmurs. Abdomen: Nondistended, nontender. Skin: mildly hyperpigmented macules on upper arms. Extremities:  No clubbing, cyanosis or edema. Neuro:   Grossly intact.  Diagnositics/Labs: None today  Assessment and plan:   Chronic hives  - at this time etiology of hives and swelling is spontaneous/idiopathic.  Hives can be caused by a variety of different triggers including illness/infection, foods, medications, stings, exercise, pressure, vibrations, extremes of temperature to name a few however majority of the time there is no identifiable trigger. Labwork did not reveal any underlying causes  - with positive IgE to egg, wheat, shrimp and corn at this time would avoid in the diet.  It is less likely to be triggering hives as you have daily hives and have a varied diet.  However until hives are under better control and hopefully able to come off antihistamines would recommend avoidance until able to challenge to ensure not allergic.    - for hive control recommend use  of high-dose antihistamine regimen:   Cetirizine (Zyrtec) 10mg  twice a day and famotidine 20 mg (Pepcid) twice a day and Singulair 10mg  daily. If no symptoms for 7 days then decrease to.  Cetirizine (Zyrtec) 10mg  twice a day and famotidine 20 mg (Pepcid) once a day and Singulair  10mg  daily.  If no symptoms for 7 days then decrease to.  Cetirizine (Zyrtec) 10mg  twice a day and Singulair 10mg  daily.  If no symptoms for 7 days then decrease to.  Cetirizine (Zyrtec) 10mg  once a day and Singulair 10mg  daily.  If no symptoms for 7 days then decrease to.  Singulair 10mg  daily.  If no symptoms for 7 days then stop Singulair and wean complete  May use Benadryl (diphenhydramine) as needed for breakthrough hives        If symptoms return, then step up dosage to whatever was last weaned and continue that regimen.  Let us know how the wean goes  - Xolair monthly injections previously discussed and is a step-up therapy if high-dose antihistamines is not effective enough in management symptoms.    Allergic rhinitis with conjunctivitis  -continue avoidance measures for dust mites, dog dander, molds, grass pollens, tree pollens, weed pollens, cockroach  - continue Zyrtec daily as needed for control of allergy symptoms  - for itchy/watery eyes can use OTC Pataday 1 drop each eye daily as needed  - for nasal drainage or congestion can use OTC Flonase, Rhinocort or Nasacort 2 sprays daily as needed.    Follow-up 3-4 months or sooner if needed  I appreciate the opportunity to take part in Gulianna's care. Please do not hesitate to contact me with questions.  Sincerely,   Prudy Feeler, MD Allergy/Immunology [facility]

## 2019-12-24 ENCOUNTER — Ambulatory Visit: Payer: 59 | Admitting: Registered Nurse

## 2019-12-25 ENCOUNTER — Other Ambulatory Visit (HOSPITAL_COMMUNITY)
Admission: RE | Admit: 2019-12-25 | Discharge: 2019-12-25 | Disposition: A | Discharge status: Home / Self Care | Payer: 59 | Source: Ambulatory Visit | Attending: Registered Nurse | Admitting: Registered Nurse

## 2019-12-25 ENCOUNTER — Other Ambulatory Visit: Payer: Self-pay

## 2019-12-25 ENCOUNTER — Ambulatory Visit: Payer: 59 | Admitting: Registered Nurse

## 2019-12-25 ENCOUNTER — Encounter: Payer: Self-pay | Admitting: Registered Nurse

## 2019-12-25 VITALS — BP 107/76 | HR 75 | Temp 97.6°F | Ht 64.0 in | Wt 174.0 lb

## 2019-12-25 DIAGNOSIS — B373 Candidiasis of vulva and vagina: Secondary | ICD-10-CM | POA: Diagnosis not present

## 2019-12-25 DIAGNOSIS — N898 Other specified noninflammatory disorders of vagina: Secondary | ICD-10-CM

## 2019-12-25 DIAGNOSIS — Z113 Encounter for screening for infections with a predominantly sexual mode of transmission: Secondary | ICD-10-CM | POA: Insufficient documentation

## 2019-12-25 DIAGNOSIS — R102 Pelvic and perineal pain: Secondary | ICD-10-CM

## 2019-12-25 DIAGNOSIS — Z23 Encounter for immunization: Secondary | ICD-10-CM | POA: Diagnosis not present

## 2019-12-25 DIAGNOSIS — B3731 Acute candidiasis of vulva and vagina: Secondary | ICD-10-CM

## 2019-12-25 LAB — POCT WET + KOH PREP: Trich by wet prep: ABSENT

## 2019-12-25 LAB — POCT URINALYSIS DIP (CLINITEK)
Bilirubin, UA: NEGATIVE
Blood, UA: NEGATIVE
Glucose, UA: NEGATIVE mg/dL
Ketones, POC UA: NEGATIVE mg/dL
Nitrite, UA: NEGATIVE
POC PROTEIN,UA: 30 — AB
Spec Grav, UA: 1.025 (ref 1.010–1.025)
Urobilinogen, UA: 4 E.U./dL — AB
pH, UA: 7.5 (ref 5.0–8.0)

## 2019-12-25 MED ORDER — FLUCONAZOLE 150 MG PO TABS: 150.0000 mg | ORAL_TABLET | Freq: Once | ORAL | 0 refills | Status: AC

## 2019-12-25 MED FILL — FLUCONAZOLE 150 MG TABLET: 150 | 1 days supply | Qty: 1 | Fill #0

## 2019-12-25 NOTE — Patient Instructions (Signed)
° ° ° °  If you have lab work done today you will be contacted with your lab results within the next 2 weeks.  If you have not heard from us then please contact us. The fastest way to get your results is to register for My Chart. ° ° °IF you received an x-ray today, you will receive an invoice from [facility]. Please contact [facility] at [phone] with questions or concerns regarding your invoice.  ° °IF you received labwork today, you will receive an invoice from [facility]. Please contact [facility] at [phone] with questions or concerns regarding your invoice.  ° °Our billing staff will not be able to assist you with questions regarding bills from these companies. ° °You will be contacted with the lab results as soon as they are available. The fastest way to get your results is to activate your My Chart account. Instructions are located on the last page of this paperwork. If you have not heard from us regarding the results in 2 weeks, please contact this office. °  ° ° ° °

## 2019-12-26 LAB — HIV ANTIBODY (ROUTINE TESTING W REFLEX): HIV Screen 4th Generation wRfx: NONREACTIVE

## 2019-12-26 LAB — RPR: RPR Ser Ql: NONREACTIVE

## 2019-12-27 ENCOUNTER — Encounter: Payer: Self-pay | Admitting: Registered Nurse

## 2019-12-27 LAB — URINE CYTOLOGY ANCILLARY ONLY
Chlamydia: NEGATIVE
Comment: NEGATIVE
Comment: NEGATIVE
Comment: NORMAL
Neisseria Gonorrhea: NEGATIVE
Trichomonas: NEGATIVE

## 2019-12-28 ENCOUNTER — Encounter: Payer: Self-pay | Admitting: Family Medicine

## 2020-01-01 ENCOUNTER — Other Ambulatory Visit: Payer: Self-pay | Admitting: Registered Nurse

## 2020-01-01 ENCOUNTER — Encounter: Payer: Self-pay | Admitting: Registered Nurse

## 2020-01-01 DIAGNOSIS — N898 Other specified noninflammatory disorders of vagina: Secondary | ICD-10-CM

## 2020-01-01 MED ORDER — FLUCONAZOLE 150 MG PO TABS: 150.0000 mg | ORAL_TABLET | Freq: Once | ORAL | 0 refills | Status: AC

## 2020-01-01 MED ORDER — METRONIDAZOLE 500 MG PO TABS: 500.0000 mg | ORAL_TABLET | Freq: Three times a day (TID) | ORAL | 0 refills | Status: DC

## 2020-01-01 MED FILL — FLUCONAZOLE 150 MG TABLET: 150 | 1 days supply | Qty: 1 | Fill #0

## 2020-01-01 MED FILL — metroNIDAZOLE 500 MG TABS: 500 | 7 days supply | Qty: 21 | Fill #0

## 2020-01-06 NOTE — Progress Notes (Signed)
Acute Office Visit  Subjective:    Patient ID: Crystal Simpson, female    DOB: 26-Dec-1990, 29 y.o.   MRN: [id number]  Chief Complaint  Patient presents with  . Urinary Tract Infection    patient states she thinks she has an urinary tract infection a week ago she had some cramping and pressure. But today she is she is having some white discharge and some burning at this time and itching and swelling at the moment     HPI Patient is in today for UTI or vaginal infection  Having some itching, white discharge, and burning in the vulva.  Feels swollen and tender Had some urinary symptoms that now seem to have resolved Denies known exposure to STD but requests testing  Has had vaginal infection in remote past but this feels worse/different  Past Medical History:  Diagnosis Date  . Urticaria     Past Surgical History:  Procedure Laterality Date  . CESAREAN SECTION    . WISDOM TOOTH EXTRACTION      Family History  Problem Relation Age of Onset  . Rheum arthritis Mother   . Eczema Mother   . Healthy Father   . Healthy Sister   . Healthy Brother   . Healthy Daughter   . Diabetes Maternal Grandmother   . Hypertension Maternal Grandfather   . Parkinson's disease Paternal Grandmother     Social History   Socioeconomic History  . Marital status: Married    Spouse name: Not on file  . Number of children: Not on file  . Years of education: Not on file  . Highest education level: Not on file  Occupational History  . Occupation: nurse  Tobacco Use  . Smoking status: Never Smoker  . Smokeless tobacco: Never Used  Substance and Sexual Activity  . Alcohol use: Yes    Comment: occ  . Drug use: No  . Sexual activity: Yes    Birth control/protection: I.U.D.  Other Topics Concern  . Not on file  Social History Narrative   Nurse for [employer]   Social Determinants of Health   Financial Resource Strain: Low Risk   . Difficulty of Paying Living Expenses: Not hard at all    Food Insecurity: No Food Insecurity  . Worried About Charity fundraiser in the Last Year: Never true  . Ran Out of Food in the Last Year: Never true  Transportation Needs: No Transportation Needs  . Lack of Transportation (Medical): No  . Lack of Transportation (Non-Medical): No  Physical Activity:   . Days of Exercise per Week:   . Minutes of Exercise per Session:   Stress:   . Feeling of Stress :   Social Connections: Unknown  . Frequency of Communication with Friends and Family: Not on file  . Frequency of Social Gatherings with Friends and Family: Not on file  . Attends Religious Services: Not on file  . Active Member of Clubs or Organizations: Not on file  . Attends Archivist Meetings: Not on file  . Marital Status: Married  Human resources officer Violence: Not At Risk  . Fear of Current or Ex-Partner: No  . Emotionally Abused: No  . Physically Abused: No  . Sexually Abused: No    Outpatient Medications Prior to Visit  Medication Sig Dispense Refill  . cetirizine (ZYRTEC) 10 MG tablet Take 1 tablet (10 mg total) by mouth 2 (two) times daily. 60 tablet 5  . famotidine (PEPCID) 20 MG tablet Take 1  tablet (20 mg total) by mouth 2 (two) times daily. 60 tablet 5  . levonorgestrel (MIRENA) 20 MCG/24HR IUD 1 each by Intrauterine route once.    . montelukast (SINGULAIR) 10 MG tablet Take 1 tablet (10 mg total) by mouth daily. 30 tablet 5  . triamcinolone cream (KENALOG) 0.1 % Apply 1 application topically 2 (two) times daily. 300 g 0   No facility-administered medications prior to visit.    No Known Allergies  Review of Systems  Constitutional: Negative.   HENT: Negative.   Eyes: Negative.   Respiratory: Negative.   Cardiovascular: Negative.   Gastrointestinal: Negative.   Endocrine: Negative.   Genitourinary: Positive for vaginal discharge and vaginal pain. Negative for decreased urine volume, flank pain, frequency and urgency.  Musculoskeletal: Negative.   Skin:  Negative.   Allergic/Immunologic: Negative.   Neurological: Negative.   Hematological: Negative.   Psychiatric/Behavioral: Negative.   All other systems reviewed and are negative.      Objective:    Physical Exam Vitals and nursing note reviewed. Exam conducted with a chaperone present.  Constitutional:      Appearance: Normal appearance. She is normal weight.  Cardiovascular:     Rate and Rhythm: Normal rate and regular rhythm.  Pulmonary:     Effort: Pulmonary effort is normal. No respiratory distress.  Genitourinary:    General: Normal vulva.     Pubic Area: No rash or pubic lice.      Labia:        Right: Tenderness present.        Left: Tenderness present.      Vagina: Vaginal discharge, erythema and tenderness present.     Cervix: Normal.     Uterus: Normal.      Adnexa: Right adnexa normal and left adnexa normal.     Rectum: Normal.  Neurological:     General: No focal deficit present.     Mental Status: She is alert and oriented to person, place, and time. Mental status is at baseline.  Psychiatric:        Mood and Affect: Mood normal.        Behavior: Behavior normal.        Thought Content: Thought content normal.        Judgment: Judgment normal.     BP 107/76   Pulse 75   Temp 97.6 F (36.4 C) (Temporal)   Ht 5\' 4"  (1.626 m)   Wt 174 lb (78.9 kg)   SpO2 99%   BMI 29.87 kg/m  Wt Readings from Last 3 Encounters:  12/25/19 174 lb (78.9 kg)  10/19/19 188 lb (85.3 kg)  10/10/19 191 lb 12.8 oz (87 kg)    There are no preventive care reminders to display for this patient.  There are no preventive care reminders to display for this patient.   No results found for: TSH Lab Results  Component Value Date   WBC 13.1 (H) 08/08/2019   HGB 11.9 08/08/2019   HCT 36.3 08/08/2019   MCV 89 08/08/2019   PLT 321 08/08/2019   Lab Results  Component Value Date   NA 141 07/11/2019   K 4.1 07/11/2019   CO2 23 07/11/2019   GLUCOSE 69 07/11/2019   BUN 8  07/11/2019   CREATININE 0.62 07/11/2019   BILITOT 0.5 07/11/2019   ALKPHOS 68 07/11/2019   AST 15 07/11/2019   ALT 13 07/11/2019   PROT 7.3 07/11/2019   ALBUMIN 4.6 07/11/2019   CALCIUM 9.6 07/11/2019  ANIONGAP 9 11/01/2015   Lab Results  Component Value Date   CHOL 212 (H) 07/11/2019   Lab Results  Component Value Date   HDL 45 07/11/2019   Lab Results  Component Value Date   LDLCALC 151 (H) 07/11/2019   Lab Results  Component Value Date   TRIG 87 07/11/2019   Lab Results  Component Value Date   CHOLHDL 4.7 (H) 07/11/2019   No results found for: HGBA1C     Assessment & Plan:   Problem List Items Addressed This Visit    None    Visit Diagnoses    Vaginal yeast infection    -  Primary   Need for diphtheria-tetanus-pertussis (Tdap) vaccine       Suprapubic pain       Relevant Orders   POCT URINALYSIS DIP (CLINITEK) (Completed)   Vaginal discharge       Relevant Orders   POCT Wet + KOH Prep (Completed)   Screen for STD (sexually transmitted disease)       Relevant Orders   Urine cytology ancillary only (Completed)   HIV antibody (with reflex) (Completed)   RPR (Completed)       Meds ordered this encounter  Medications  . fluconazole (DIFLUCAN) 150 MG tablet    Sig: Take 1 tablet (150 mg total) by mouth once for 1 dose.    Dispense:  1 tablet    Refill:  0    Order Specific Question:   Supervising Provider    Answer:   Doristine Bosworth [[id number]]   PLAN  Wet prep shows candidal infection  Diflucan 150mg  PO once  May follow up with course of metronidazole if needed for BV  Std screening gathered, will follow up as warranted  Patient encouraged to call clinic with any questions, comments, or concerns.   June, NP

## 2020-01-07 ENCOUNTER — Ambulatory Visit: Payer: 59 | Admitting: Family Medicine

## 2020-01-08 NOTE — Telephone Encounter (Signed)
Called Pt to change

## 2020-01-09 ENCOUNTER — Ambulatory Visit: Payer: 59 | Admitting: Family Medicine

## 2020-01-14 ENCOUNTER — Ambulatory Visit: Payer: 59 | Admitting: Family Medicine

## 2020-01-23 ENCOUNTER — Ambulatory Visit: Payer: 59 | Admitting: Family Medicine

## 2020-01-23 ENCOUNTER — Encounter: Payer: Self-pay | Admitting: Family Medicine

## 2020-01-23 ENCOUNTER — Other Ambulatory Visit: Payer: Self-pay

## 2020-01-23 VITALS — BP 117/74 | HR 71 | Temp 98.0°F | Resp 16 | Ht 64.0 in | Wt 172.6 lb

## 2020-01-23 DIAGNOSIS — N898 Other specified noninflammatory disorders of vagina: Secondary | ICD-10-CM

## 2020-01-23 LAB — POCT WET + KOH PREP
Trich by wet prep: ABSENT
Yeast by KOH: ABSENT
Yeast by wet prep: ABSENT

## 2020-01-23 MED ORDER — FLUCONAZOLE 150 MG PO TABS: 150.0000 mg | ORAL_TABLET | Freq: Once | ORAL | 1 refills | Status: AC

## 2020-01-23 MED FILL — FLUCONAZOLE 150 MG TABLET: 150 | 3 days supply | Qty: 2 | Fill #0

## 2020-01-23 NOTE — Progress Notes (Unsigned)
Established Patient Office Visit  Subjective:  Patient ID: Crystal Simpson, female    DOB: 1991-06-18  Age: 29 y.o. MRN: [id number]  CC:  Chief Complaint  Patient presents with  . mirena removal  . Medication Refill    pepcid    HPI Crystal Simpson presents for ***  Past Medical History:  Diagnosis Date  . Urticaria     Past Surgical History:  Procedure Laterality Date  . CESAREAN SECTION    . WISDOM TOOTH EXTRACTION      Family History  Problem Relation Age of Onset  . Rheum arthritis Mother   . Eczema Mother   . Healthy Father   . Healthy Sister   . Healthy Brother   . Healthy Daughter   . Diabetes Maternal Grandmother   . Hypertension Maternal Grandfather   . Parkinson's disease Paternal Grandmother     Social History   Socioeconomic History  . Marital status: Married    Spouse name: Not on file  . Number of children: Not on file  . Years of education: Not on file  . Highest education level: Not on file  Occupational History  . Occupation: nurse  Tobacco Use  . Smoking status: Never Smoker  . Smokeless tobacco: Never Used  Substance and Sexual Activity  . Alcohol use: Yes    Comment: occ  . Drug use: No  . Sexual activity: Yes    Birth control/protection: I.U.D.  Other Topics Concern  . Not on file  Social History Narrative   Nurse for [employer]   Social Determinants of Health   Financial Resource Strain: Low Risk   . Difficulty of Paying Living Expenses: Not hard at all  Food Insecurity: No Food Insecurity  . Worried About Programme researcher, broadcasting/film/video in the Last Year: Never true  . Ran Out of Food in the Last Year: Never true  Transportation Needs: No Transportation Needs  . Lack of Transportation (Medical): No  . Lack of Transportation (Non-Medical): No  Physical Activity:   . Days of Exercise per Week:   . Minutes of Exercise per Session:   Stress:   . Feeling of Stress :   Social Connections: Unknown  . Frequency of Communication with Friends  and Family: Not on file  . Frequency of Social Gatherings with Friends and Family: Not on file  . Attends Religious Services: Not on file  . Active Member of Clubs or Organizations: Not on file  . Attends Banker Meetings: Not on file  . Marital Status: Married  Catering manager Violence: Not At Risk  . Fear of Current or Ex-Partner: No  . Emotionally Abused: No  . Physically Abused: No  . Sexually Abused: No    Outpatient Medications Prior to Visit  Medication Sig Dispense Refill  . cetirizine (ZYRTEC) 10 MG tablet Take 1 tablet (10 mg total) by mouth 2 (two) times daily. 60 tablet 5  . famotidine (PEPCID) 20 MG tablet Take 1 tablet (20 mg total) by mouth 2 (two) times daily. 60 tablet 5  . levonorgestrel (MIRENA) 20 MCG/24HR IUD 1 each by Intrauterine route once.    . montelukast (SINGULAIR) 10 MG tablet Take 1 tablet (10 mg total) by mouth daily. 30 tablet 5  . metroNIDAZOLE (FLAGYL) 500 MG tablet Take 1 tablet (500 mg total) by mouth 3 (three) times daily. (Patient not taking: Reported on 01/23/2020) 21 tablet 0  . triamcinolone cream (KENALOG) 0.1 % Apply 1 application topically 2 (two) times  daily. (Patient not taking: Reported on 01/23/2020) 300 g 0   No facility-administered medications prior to visit.    No Known Allergies  ROS Review of Systems    Objective:    Physical Exam  BP 117/74 (BP Location: Right Arm, Patient Position: Sitting, Cuff Size: Normal)   Pulse 71   Temp 98 F (36.7 C) (Temporal)   Resp 16   Ht 5\' 4"  (1.626 m)   Wt 172 lb 9.6 oz (78.3 kg)   SpO2 97%   BMI 29.63 kg/m  Wt Readings from Last 3 Encounters:  01/23/20 172 lb 9.6 oz (78.3 kg)  12/25/19 174 lb (78.9 kg)  10/19/19 188 lb (85.3 kg)     There are no preventive care reminders to display for this patient.  There are no preventive care reminders to display for this patient.  No results found for: TSH Lab Results  Component Value Date   WBC 13.1 (H) 08/08/2019   HGB  11.9 08/08/2019   HCT 36.3 08/08/2019   MCV 89 08/08/2019   PLT 321 08/08/2019   Lab Results  Component Value Date   NA 141 07/11/2019   K 4.1 07/11/2019   CO2 23 07/11/2019   GLUCOSE 69 07/11/2019   BUN 8 07/11/2019   CREATININE 0.62 07/11/2019   BILITOT 0.5 07/11/2019   ALKPHOS 68 07/11/2019   AST 15 07/11/2019   ALT 13 07/11/2019   PROT 7.3 07/11/2019   ALBUMIN 4.6 07/11/2019   CALCIUM 9.6 07/11/2019   ANIONGAP 9 11/01/2015   Lab Results  Component Value Date   CHOL 212 (H) 07/11/2019   Lab Results  Component Value Date   HDL 45 07/11/2019   Lab Results  Component Value Date   LDLCALC 151 (H) 07/11/2019   Lab Results  Component Value Date   TRIG 87 07/11/2019   Lab Results  Component Value Date   CHOLHDL 4.7 (H) 07/11/2019   No results found for: HGBA1C    Assessment & Plan:   Problem List Items Addressed This Visit    None      No orders of the defined types were placed in this encounter.   Follow-up: No follow-ups on file.    Forrest Moron, MD

## 2020-01-23 NOTE — Patient Instructions (Addendum)
  Try Crystal Simpson scalp drops to help with hair line and thicken the hair. You only need to use a small amount every 2 days or so.    If you have lab work done today you will be contacted with your lab results within the next 2 weeks.  If you have not heard from [place] then please contact us. The fastest way to get your results is to register for My Chart.   IF you received an x-ray today, you will receive an invoice from [facility]. Please contact [facility] at [phone] with questions or concerns regarding your invoice.   IF you received labwork today, you will receive an invoice from [facility]. Please contact [facility] at [phone] with questions or concerns regarding your invoice.   Our billing staff will not be able to assist you with questions regarding bills from these companies.  You will be contacted with the lab results as soon as they are available. The fastest way to get your results is to activate your My Chart account. Instructions are located on the last page of this paperwork. If you have not heard from [place] regarding the results in 2 weeks, please contact this office.

## 2020-01-25 ENCOUNTER — Encounter: Payer: Self-pay | Admitting: Family Medicine

## 2020-01-27 ENCOUNTER — Encounter: Payer: Self-pay | Admitting: Family Medicine

## 2020-02-05 ENCOUNTER — Other Ambulatory Visit: Payer: Self-pay | Admitting: Family Medicine

## 2020-02-05 ENCOUNTER — Ambulatory Visit (INDEPENDENT_AMBULATORY_CARE_PROVIDER_SITE_OTHER): Payer: 59 | Admitting: Family Medicine

## 2020-02-05 ENCOUNTER — Other Ambulatory Visit: Payer: Self-pay

## 2020-02-05 ENCOUNTER — Encounter: Payer: Self-pay | Admitting: Family Medicine

## 2020-02-05 VITALS — BP 105/69 | HR 68 | Temp 98.3°F | Ht 64.0 in | Wt 173.0 lb

## 2020-02-05 DIAGNOSIS — N921 Excessive and frequent menstruation with irregular cycle: Secondary | ICD-10-CM

## 2020-02-05 DIAGNOSIS — Z3049 Encounter for surveillance of other contraceptives: Secondary | ICD-10-CM

## 2020-02-05 LAB — POCT URINE PREGNANCY: Preg Test, Ur: NEGATIVE

## 2020-02-05 MED ORDER — NORGESTIMATE-ETH ESTRADIOL 0.25-35 MG-MCG PO TABS
1.0000 | ORAL_TABLET | Freq: Every day | ORAL | 6 refills | Status: DC
Start: 2020-02-05 — End: 2021-04-22

## 2020-02-05 MED ORDER — FAMOTIDINE 20 MG PO TABS: 20.0000 mg | ORAL_TABLET | Freq: Two times a day (BID) | ORAL | 5 refills | Status: DC

## 2020-02-05 NOTE — Patient Instructions (Signed)
° ° ° °  If you have lab work done today you will be contacted with your lab results within the next 2 weeks.  If you have not heard from us then please contact us. The fastest way to get your results is to register for My Chart. ° ° °IF you received an x-ray today, you will receive an invoice from [facility]. Please contact [facility] at [phone] with questions or concerns regarding your invoice.  ° °IF you received labwork today, you will receive an invoice from [facility]. Please contact [facility] at [phone] with questions or concerns regarding your invoice.  ° °Our billing staff will not be able to assist you with questions regarding bills from these companies. ° °You will be contacted with the lab results as soon as they are available. The fastest way to get your results is to activate your My Chart account. Instructions are located on the last page of this paperwork. If you have not heard from us regarding the results in 2 weeks, please contact this office. °  ° ° ° °

## 2020-02-05 NOTE — Progress Notes (Signed)
5/11/20214:38 PM  Crystal Simpson 1991-07-09, 29 y.o., female [id number]  Chief Complaint  Patient presents with  . Contraception    discuss IUD , current heavy menstrual flow    HPI:   Patient is a 29 y.o. female who presents today for heavy bleeding, BC  [id number] Menarche 29 yo Menses regular, 5-7 days, normal flow Has had 2 IUDs, last replacement was 2016 Last IUD removed about 2 weeks ago as 5 years mark Intention was to have replaced but not available at that time As of today she does not want to have IUD  She would like to start OCPs instead Has taken in the past for h/o ovarian cyst Non smoker, no migraines, no VTE She thinks she might want to get pregnant within the year She has had heavy bleeding for past several days, having to change pads every 3-4 hours, feels clots are getting better Several days ago had palpitations, shaky for about 2 hours that resolved, no chest pain or SOB Had very quick LLQ pain yesterday, stronger than cramps Denies any abnormal vaginal discharge  Depression screen [facility] 2/9 02/05/2020 01/23/2020 10/10/2019  Decreased Interest 0 0 0  Down, Depressed, Hopeless 0 0 0  PHQ - 2 Score 0 0 0    Fall Risk  02/05/2020 01/23/2020 12/25/2019 10/10/2019 08/01/2019  Falls in the past year? 0 0 0 0 0  Number falls in past yr: 0 0 0 0 0  Injury with Fall? 0 0 0 0 0  Follow up Falls evaluation completed Falls evaluation completed - Falls evaluation completed Falls evaluation completed     Allergies  Allergen Reactions  . Eggs Or Egg-Derived Products Hives  . Shrimp [Shellfish Allergy]     Unknown reaction  . Wheat Bran     Unknown reaction     Prior to Admission medications   Medication Sig Start Date End Date Taking? Authorizing Provider  cetirizine (ZYRTEC) 10 MG tablet Take 1 tablet (10 mg total) by mouth 2 (two) times daily. 08/08/19  Yes Padgett, Rae Halsted, MD  famotidine (PEPCID) 20 MG tablet Take 1 tablet (20 mg total) by mouth 2 (two)  times daily. 08/08/19  Yes Padgett, Rae Halsted, MD  montelukast (SINGULAIR) 10 MG tablet Take 1 tablet (10 mg total) by mouth daily. 08/08/19  Yes Kennith Gain, MD  levonorgestrel (MIRENA) 20 MCG/24HR IUD 1 each by Intrauterine route once.    [provider]  triamcinolone cream (KENALOG) 0.1 % Apply 1 application topically 2 (two) times daily. Patient not taking: Reported on 02/05/2020 10/10/19   Forrest Moron, MD    Past Medical History:  Diagnosis Date  . Urticaria     Past Surgical History:  Procedure Laterality Date  . CESAREAN SECTION    . WISDOM TOOTH EXTRACTION      Social History   Tobacco Use  . Smoking status: Never Smoker  . Smokeless tobacco: Never Used  Substance Use Topics  . Alcohol use: Yes    Comment: occ    Family History  Problem Relation Age of Onset  . Rheum arthritis Mother   . Eczema Mother   . Healthy Father   . Healthy Sister   . Healthy Brother   . Healthy Daughter   . Diabetes Maternal Grandmother   . Hypertension Maternal Grandfather   . Parkinson's disease Paternal Grandmother     ROS Per hpi  OBJECTIVE:  Today's Vitals   02/05/20 1637  BP: 105/69  Pulse: 68  Temp: 98.3 F (36.8 C)  SpO2: 97%  Weight: 173 lb (78.5 kg)  Height: 5\' 4"  (1.626 m)   Body mass index is 29.7 kg/m.   Physical Exam Vitals and nursing note reviewed.  Constitutional:      Appearance: She is well-developed.  HENT:     Head: Normocephalic and atraumatic.  Eyes:     General: No scleral icterus.    Conjunctiva/sclera: Conjunctivae normal.     Pupils: Pupils are equal, round, and reactive to light.  Pulmonary:     Effort: Pulmonary effort is normal.  Musculoskeletal:     Cervical back: Neck supple.  Skin:    General: Skin is warm and dry.  Neurological:     Mental Status: She is alert and oriented to person, place, and time.     Results for orders placed or performed in visit on 02/05/20 (from the past 24  hour(s))  POCT urine pregnancy     Status: None   Collection Time: 02/05/20  4:43 PM  Result Value Ref Range   Preg Test, Ur Negative Negative    No results found.   ASSESSMENT and PLAN  1. Menorrhagia with irregular cycle Discussed expected with recent d/c of BC. Start OCPs as requested. Discussed COC use. Reviewed r/se/b. Patient educational handout given. - POCT urine pregnancy - CBC  2. Encounter for surveillance of other contraceptive See #1  Other orders - famotidine (PEPCID) 20 MG tablet; Take 1 tablet (20 mg total) by mouth 2 (two) times daily. - norgestimate-ethinyl estradiol (ORTHO-CYCLEN) 0.25-35 MG-MCG tablet; Take 1 tablet by mouth daily. Skip placebo pills and restart a new pack every 21 days.  Return in about 3 months (around 05/07/2020).    07/07/2020, MD [facility] at [facility] [address] [place], [place] Ph.  [phone] Fax [phone]

## 2020-02-06 LAB — CBC
Hematocrit: 34.7 % (ref 34.0–46.6)
Hemoglobin: 11.2 g/dL (ref 11.1–15.9)
MCH: 28.6 pg (ref 26.6–33.0)
MCHC: 32.3 g/dL (ref 31.5–35.7)
MCV: 89 fL (ref 79–97)
Platelets: 287 10*3/uL (ref 150–450)
RBC: 3.91 x10E6/uL (ref 3.77–5.28)
RDW: 12.5 % (ref 11.7–15.4)
WBC: 12.7 10*3/uL — ABNORMAL HIGH (ref 3.4–10.8)

## 2020-02-22 ENCOUNTER — Telehealth: Payer: Self-pay | Admitting: Emergency Medicine

## 2020-02-22 NOTE — Telephone Encounter (Signed)
Left a msg on patient machine to return call to let us know if she still would like for our office to schedule her an appt to have the Mirena placed.  Dr Creta Levin is no longer her but She has spoken to Dr Leretha Pol and Dr Leretha Pol would be the one whom would place the Mirena.

## 2020-03-28 MED FILL — FAMOTIDINE 20 MG TABS: 20 | 30 days supply | Qty: 60 | Fill #1

## 2020-03-28 MED FILL — FEMYNOR 0.25-35 MG-MCG TABS: 0.25-35 | 63 days supply | Qty: 84 | Fill #1

## 2020-05-06 ENCOUNTER — Ambulatory Visit: Payer: 59 | Admitting: Family Medicine

## 2020-05-07 ENCOUNTER — Encounter: Payer: Self-pay | Admitting: Family Medicine

## 2020-06-09 MED FILL — FAMOTIDINE 20 MG TABS: 20 | 30 days supply | Qty: 60 | Fill #2

## 2020-06-09 MED FILL — MONTELUKAST SOD 10 MG TAB: 10 | 30 days supply | Qty: 30 | Fill #2

## 2020-06-09 MED FILL — FEMYNOR 0.25-35 MG-MCG TABS: 0.25-35 | 63 days supply | Qty: 84 | Fill #2

## 2020-08-18 ENCOUNTER — Other Ambulatory Visit (HOSPITAL_COMMUNITY): Payer: Self-pay | Admitting: Obstetrics and Gynecology

## 2020-08-18 DIAGNOSIS — Z3041 Encounter for surveillance of contraceptive pills: Secondary | ICD-10-CM | POA: Diagnosis not present

## 2020-08-18 DIAGNOSIS — E669 Obesity, unspecified: Secondary | ICD-10-CM | POA: Diagnosis not present

## 2020-08-18 DIAGNOSIS — Z130 Encounter for screening for diseases of the blood and blood-forming organs and certain disorders involving the immune mechanism: Secondary | ICD-10-CM | POA: Diagnosis not present

## 2020-08-18 DIAGNOSIS — Z01419 Encounter for gynecological examination (general) (routine) without abnormal findings: Secondary | ICD-10-CM | POA: Diagnosis not present

## 2020-08-18 DIAGNOSIS — Z1389 Encounter for screening for other disorder: Secondary | ICD-10-CM | POA: Diagnosis not present

## 2020-08-18 MED FILL — FEMYNOR 0.25-35 MG-MCG TABS: 0.25-35 | 63 days supply | Qty: 84 | Fill #0

## 2020-09-03 MED FILL — FAMOTIDINE 20 MG TABS: 20 | 30 days supply | Qty: 60 | Fill #3

## 2020-09-03 MED FILL — FEMYNOR 0.25-35 MG-MCG TABS: 0.25-35 | 63 days supply | Qty: 84 | Fill #0

## 2020-10-30 ENCOUNTER — Other Ambulatory Visit: Payer: Self-pay | Admitting: Allergy

## 2020-10-30 MED FILL — FEMYNOR 0.25-35 MG-MCG TABS: 0.25-35 | 63 days supply | Qty: 84 | Fill #1

## 2020-10-30 MED FILL — MONTELUKAST SOD 10 MG TAB: 10 | 30 days supply | Qty: 30 | Fill #0

## 2020-10-30 MED FILL — FAMOTIDINE 20 MG TABS: 20 | 30 days supply | Qty: 60 | Fill #4

## 2020-11-03 ENCOUNTER — Ambulatory Visit: Payer: 59 | Admitting: Registered Nurse

## 2020-11-14 DIAGNOSIS — H5203 Hypermetropia, bilateral: Secondary | ICD-10-CM | POA: Diagnosis not present

## 2020-11-14 DIAGNOSIS — H52223 Regular astigmatism, bilateral: Secondary | ICD-10-CM | POA: Diagnosis not present

## 2020-11-20 ENCOUNTER — Encounter: Payer: Self-pay | Admitting: Allergy

## 2020-11-20 ENCOUNTER — Other Ambulatory Visit: Payer: Self-pay

## 2020-11-20 ENCOUNTER — Ambulatory Visit (INDEPENDENT_AMBULATORY_CARE_PROVIDER_SITE_OTHER): Payer: 59 | Admitting: Allergy

## 2020-11-20 VITALS — BP 102/68 | HR 70 | Resp 18 | Ht 64.0 in | Wt 180.0 lb

## 2020-11-20 DIAGNOSIS — L508 Other urticaria: Secondary | ICD-10-CM

## 2020-11-20 DIAGNOSIS — J3089 Other allergic rhinitis: Secondary | ICD-10-CM

## 2020-11-20 DIAGNOSIS — H1013 Acute atopic conjunctivitis, bilateral: Secondary | ICD-10-CM | POA: Diagnosis not present

## 2020-11-20 NOTE — Patient Instructions (Signed)
Chronic hives  - at this time etiology of hives and swelling is spontaneous/idiopathic.  Hives can be caused by a variety of different triggers including illness/infection, foods, medications, stings, exercise, pressure, vibrations, extremes of temperature to name a few however majority of the time there is no identifiable trigger. Labwork did not reveal any underlying causes  - with positive IgE to egg, wheat, shrimp and corn at this time would avoid in the diet.  It is less likely to be triggering hives as you have daily hives and have a varied diet.  However until hives are under better control and hopefully able to come off antihistamines would recommend avoidance until able to challenge to ensure not allergic.    - for hive control recommend use of high-dose antihistamine regimen - and continue full dosing at this time.  Will try to wean again in future after starting Xolair:   Cetirizine (Zyrtec) 10mg  twice a day and famotidine 20 mg (Pepcid) twice a day and Singulair 10mg  daily. If no symptoms for 7 days then decrease to.  Cetirizine (Zyrtec) 10mg  twice a day and famotidine 20 mg (Pepcid) once a day and Singulair 10mg  daily.  If no symptoms for 7 days then decrease to.  Cetirizine (Zyrtec) 10mg  twice a day and Singulair 10mg  daily.  If no symptoms for 7 days then decrease to.  Cetirizine (Zyrtec) 10mg  once a day and Singulair 10mg  daily.  If no symptoms for 7 days then decrease to.  Singulair 10mg  daily.  If no symptoms for 7 days then stop Singulair and wean complete  May use Benadryl (diphenhydramine) as needed for breakthrough hives        If symptoms return, then step up dosage to whatever was last weaned and continue that regimen.   - Xolair monthly injections discussed today including benefits, risk and protocol.  Informational brochure provided.  Will start approval process.  Tammy, our biologics coordinator, will call you to go over approval.  Will prescribe epinephrine device to  have on days of injections.    Allergic rhinitis with conjunctivitis  -continue avoidance measures for dust mites, dog dander, molds, grass pollens, tree pollens, weed pollens, cockroach  - continue Zyrtec daily as needed for control of allergy symptoms  - for itchy/watery eyes can use OTC Pataday 1 drop each eye daily as needed  - for nasal drainage or congestion can use OTC Flonase, Rhinocort or Nasacort 2 sprays daily as needed.    Follow-up 4-6 months or sooner if needed

## 2020-11-20 NOTE — Progress Notes (Signed)
Follow-up Note  RE: Crystal Simpson MRN: [id number] DOB: 02-07-1991 Date of Office Visit: 11/20/2020   History of present illness: Crystal Simpson is a 30 y.o. female presenting today for follow-up of chronic urticaria and allergic rhinitis with conjunctivitis.  She was last in the office on 10/19/2019 by myself.  She states she was able to wean completely off of the antihistamine regimen however the hives returned about a month later and she went back on the medication 1 at a time time and she is back up to the full regimen of Zyrtec twice a day, Pepcid twice a day and singular daily.  She states when she takes this regimen the hives are better controlled.  If she does miss a dose of something she does note more itching and hives production.  She is getting tired of having to take all this medication for hive control.  She is interested at this time and proceeding with Xolair.    Review of systems: Review of Systems  Constitutional: Negative.   HENT: Negative.   Eyes: Negative.   Respiratory: Negative.   Cardiovascular: Negative.   Gastrointestinal: Negative.   Musculoskeletal: Negative.   Skin: Negative.   Neurological: Negative.     All other systems negative unless noted above in HPI  Past medical/social/surgical/family history have been reviewed and are unchanged unless specifically indicated below.  No changes  Medication List: Current Outpatient Medications  Medication Sig Dispense Refill  . cetirizine (ZYRTEC) 10 MG tablet Take 1 tablet (10 mg total) by mouth 2 (two) times daily. 60 tablet 5  . famotidine (PEPCID) 20 MG tablet Take 1 tablet (20 mg total) by mouth 2 (two) times daily. 60 tablet 5  . montelukast (SINGULAIR) 10 MG tablet TAKE 1 TABLET (10 MG TOTAL) BY MOUTH DAILY. 30 tablet 5  . norgestimate-ethinyl estradiol (ORTHO-CYCLEN) 0.25-35 MG-MCG tablet Take 1 tablet by mouth daily. Skip placebo pills and restart a new pack every 21 days. 3 Package 6   No current  facility-administered medications for this visit.     Known medication allergies: Allergies  Allergen Reactions  . Eggs Or Egg-Derived Products Hives  . Shrimp [Shellfish Allergy]     Unknown reaction  . Wheat Bran     Unknown reaction      Physical examination: Blood pressure 102/68, pulse 70, resp. rate 18, height 5\' 4"  (1.626 m), weight 180 lb (81.6 kg), SpO2 98 %.  General: Alert, interactive, in no acute distress. HEENT: PERRLA, TMs pearly gray, turbinates non-edematous without discharge, post-pharynx non erythematous. Neck: Supple without lymphadenopathy. Lungs: Clear to auscultation without wheezing, rhonchi or rales. {no increased work of breathing. CV: Normal S1, S2 without murmurs. Abdomen: Nondistended, nontender. Skin: Warm and dry, without lesions or rashes. Extremities:  No clubbing, cyanosis or edema. Neuro:   Grossly intact.  Diagnositics/Labs: None today  Assessment and plan:   Chronic urticaria  - at this time etiology of hives and swelling is spontaneous/idiopathic.  Hives can be caused by a variety of different triggers including illness/infection, foods, medications, stings, exercise, pressure, vibrations, extremes of temperature to name a few however majority of the time there is no identifiable trigger. Labwork did not reveal any underlying causes  - with positive IgE to egg, wheat, shrimp and corn at this time would avoid in the diet.  It is less likely to be triggering hives as you have daily hives and have a varied diet.  However until hives are under better control and hopefully able  to come off antihistamines would recommend avoidance until able to challenge to ensure not allergic.    - for hive control recommend use of high-dose antihistamine regimen - and continue full dosing at this time.  Will try to wean again in future after starting Xolair:   Cetirizine (Zyrtec) 10mg  twice a day and famotidine 20 mg (Pepcid) twice a day and Singulair 10mg  daily.  If no symptoms for 7 days then decrease to.  Cetirizine (Zyrtec) 10mg  twice a day and famotidine 20 mg (Pepcid) once a day and Singulair 10mg  daily.  If no symptoms for 7 days then decrease to.  Cetirizine (Zyrtec) 10mg  twice a day and Singulair 10mg  daily.  If no symptoms for 7 days then decrease to.  Cetirizine (Zyrtec) 10mg  once a day and Singulair 10mg  daily.  If no symptoms for 7 days then decrease to.  Singulair 10mg  daily.  If no symptoms for 7 days then stop Singulair and wean complete  May use Benadryl (diphenhydramine) as needed for breakthrough hives        If symptoms return, then step up dosage to whatever was last weaned and continue that regimen.   - Xolair monthly injections discussed today including benefits, risk and protocol.  Informational brochure provided.  Will start approval process.  Tammy, our biologics coordinator, will call you to go over approval.  Will prescribe epinephrine device to have on days of injections.    Allergic rhinitis with conjunctivitis  -continue avoidance measures for dust mites, dog dander, molds, grass pollens, tree pollens, weed pollens, cockroach  - continue Zyrtec daily as needed for control of allergy symptoms  - for itchy/watery eyes can use OTC Pataday 1 drop each eye daily as needed  - for nasal drainage or congestion can use OTC Flonase, Rhinocort or Nasacort 2 sprays daily as needed.    Follow-up 4-6 months or sooner if needed  I appreciate the opportunity to take part in Crystal Simpson's care. Please do not hesitate to contact me with questions.  Sincerely,   , MD Allergy/Immunology [facility]

## 2020-11-24 ENCOUNTER — Telehealth: Payer: Self-pay | Admitting: *Deleted

## 2020-11-24 MED ORDER — OMALIZUMAB 150 MG/ML ~~LOC~~ SOSY: 300.0000 mg | PREFILLED_SYRINGE | SUBCUTANEOUS | 11 refills | Status: DC

## 2020-11-24 NOTE — Telephone Encounter (Signed)
Called patient and advised approval, copay card and submit to [facility]. Once she picks up Rx or have delivered to clinic can set up appt to start therapy

## 2020-11-24 NOTE — Telephone Encounter (Signed)
-----   Message from [facility] Larose Hires, MD sent at 11/20/2020  6:34 PM EST ----- She is interested in Xolair for hives.  Please start approval process.  Thank you

## 2020-11-25 ENCOUNTER — Other Ambulatory Visit: Payer: Self-pay

## 2020-11-25 ENCOUNTER — Other Ambulatory Visit: Payer: Self-pay | Admitting: Internal Medicine

## 2020-11-25 ENCOUNTER — Telehealth: Payer: Self-pay | Admitting: Pharmacist

## 2020-11-25 ENCOUNTER — Ambulatory Visit (HOSPITAL_BASED_OUTPATIENT_CLINIC_OR_DEPARTMENT_OTHER): Payer: 59 | Admitting: Pharmacist

## 2020-11-25 DIAGNOSIS — Z79899 Other long term (current) drug therapy: Secondary | ICD-10-CM

## 2020-11-25 MED ORDER — OMALIZUMAB 150 MG/ML ~~LOC~~ SOSY: 300.0000 mg | PREFILLED_SYRINGE | SUBCUTANEOUS | 11 refills | Status: DC

## 2020-11-25 NOTE — Telephone Encounter (Signed)
Called patient to schedule an appointment for the [facility] [employer] [facility]. I was unable to reach the patient so I left a HIPAA-compliant message requesting that the patient return my call.   Crystal Simpson, PharmD, P[place]y [place], CPP Clinical Pharmacist [facility] [phone]

## 2020-11-25 NOTE — Progress Notes (Signed)
   S: Patient presents for review of their specialty medication therapy.  Patient is currently taking Xolair for chronic urticaria. Patient is managed by Dr. Delorse Lek for this.   Adherence: has not yet started  Efficacy: has not yet started  Dosing: Give subcutaneously.   Chronic idiopathic urticaria: SubQ: 150 or 300 mg every 4 weeks. Dosing is not dependent on serum IgE (free or total) level or body weight.  Dose adjustments: Renal: no dose adjustments  Hepatic: no dose adjustments  Toxicity: Severe hypersensitivity reaction or anaphylaxis: Discontinue treatment. Fever, arthralgia, and rash: Discontinue treatment if this constellation of symptoms occurs.  Drug-drug interactions: none identified   Monitoring: CV effects: has not yet started; counseling provided Eosinophilia and vasculitis: has not yet started; counseling provided Fever/arthralgia/rash: has not yet started; counseling provided Hypersensitivity/Anaphylaxis: has not yet started; counseling provided. Pt encouraged to reach out to Dr. Delorse Lek to request EpiPen. Malignant neoplasms: has not yet started; counseling provided  O:     Lab Results  Component Value Date   WBC 12.7 (H) 02/05/2020   HGB 11.2 02/05/2020   HCT 34.7 02/05/2020   MCV 89 02/05/2020   PLT 287 02/05/2020      Chemistry      Component Value Date/Time   NA 141 07/11/2019 1524   K 4.1 07/11/2019 1524   CL 102 07/11/2019 1524   CO2 23 07/11/2019 1524   BUN 8 07/11/2019 1524   CREATININE 0.62 07/11/2019 1524      Component Value Date/Time   CALCIUM 9.6 07/11/2019 1524   ALKPHOS 68 07/11/2019 1524   AST 15 07/11/2019 1524   ALT 13 07/11/2019 1524   BILITOT 0.5 07/11/2019 1524       A/P: 1. Medication review: patient currently on Xolair for chronic urticaria. Reviewed the medication with the patient, including the following: Xolair, omalizumab, is a novel IgE blocker.  It appears to reduce rates of hospitalizations, ER visits and  unscheduled physician visits due asthma exacerbations when added to standard therapy.  Studies also show a reduction in steroid requirements and improvement in quality of life.  Patient educated on purpose, proper use and potential adverse effects of Xolair.  Following instruction patient verbalized understanding. Patient should always have an EpiPen readily available in the event of anaphylaxis. SubQ: For SubQ injection only; doses >150 mg should be divided over more than one injection site (eg, 225 mg or 300 mg administered as two injections, 375 mg administered as three injections); each injection site should be separated by ?1 inch. Do not inject into moles, scars, bruises, tender areas, or broken skin. Injections may take 5 to 10 seconds to administer (solution is slightly viscous). Administer only under direct medical supervision and observe patient for 2 hours after the first 3 injections and 30 minutes after subsequent injections Angela Nevin 2015) or in accordance with individual institution policies and procedures.No recommendations for any changes at this time.    Butch Penny, PharmD, Patsy Baltimore, CPP Clinical Pharmacist [facility] [phone]

## 2020-12-01 MED FILL — XOLAIR 150 MG/ML SOSY: 150 | 28 days supply | Qty: 2 | Fill #0

## 2020-12-04 ENCOUNTER — Ambulatory Visit (INDEPENDENT_AMBULATORY_CARE_PROVIDER_SITE_OTHER): Payer: 59 | Admitting: Family Medicine

## 2020-12-04 ENCOUNTER — Other Ambulatory Visit: Payer: Self-pay

## 2020-12-04 DIAGNOSIS — L501 Idiopathic urticaria: Secondary | ICD-10-CM | POA: Diagnosis not present

## 2020-12-04 MED ORDER — EPINEPHRINE 0.3 MG/0.3ML IJ SOAJ: 0.3000 mg | Freq: Once | INTRAMUSCULAR | 2 refills | Status: AC

## 2020-12-04 NOTE — Progress Notes (Signed)
Immunotherapy   Patient Details  Name: Crystal Simpson MRN: [id number] Date of Birth: June 02, 1991  12/04/2020  [name] started injections for Xolair 300 mg  Frequency: Q 4 weeks Epi-Pen:Epi-Pen Available  Consent signed and patient instructions given.   [name] [name] 12/04/2020, 3:31 PM

## 2020-12-25 ENCOUNTER — Other Ambulatory Visit (HOSPITAL_COMMUNITY): Payer: Self-pay

## 2020-12-27 ENCOUNTER — Other Ambulatory Visit (HOSPITAL_COMMUNITY): Payer: Self-pay

## 2020-12-27 MED FILL — Omalizumab Subcutaneous Soln Prefilled Syringe 150 MG/ML: SUBCUTANEOUS | 28 days supply | Qty: 2 | Fill #0 | Status: AC

## 2020-12-28 ENCOUNTER — Other Ambulatory Visit (HOSPITAL_COMMUNITY): Payer: Self-pay

## 2021-01-01 ENCOUNTER — Other Ambulatory Visit: Payer: Self-pay

## 2021-01-01 ENCOUNTER — Ambulatory Visit (INDEPENDENT_AMBULATORY_CARE_PROVIDER_SITE_OTHER): Payer: 59 | Admitting: *Deleted

## 2021-01-01 DIAGNOSIS — L501 Idiopathic urticaria: Secondary | ICD-10-CM | POA: Diagnosis not present

## 2021-01-01 DIAGNOSIS — L508 Other urticaria: Secondary | ICD-10-CM

## 2021-01-01 MED ORDER — OMALIZUMAB 150 MG ~~LOC~~ SOLR
300.0000 mg | SUBCUTANEOUS | Status: AC
  Administered 2021-01-01 – 2022-05-06 (×16): 300 mg via SUBCUTANEOUS

## 2021-01-05 ENCOUNTER — Other Ambulatory Visit (HOSPITAL_COMMUNITY): Payer: Self-pay

## 2021-01-05 MED FILL — Norgestimate & Ethinyl Estradiol Tab 0.25 MG-35 MCG: ORAL | 84 days supply | Qty: 84 | Fill #0 | Status: AC

## 2021-01-05 MED FILL — Famotidine Tab 20 MG: ORAL | 30 days supply | Qty: 60 | Fill #0 | Status: AC

## 2021-01-23 ENCOUNTER — Other Ambulatory Visit (HOSPITAL_COMMUNITY): Payer: Self-pay

## 2021-01-23 MED FILL — Omalizumab Subcutaneous Soln Prefilled Syringe 150 MG/ML: SUBCUTANEOUS | 28 days supply | Qty: 2 | Fill #1 | Status: AC

## 2021-01-29 ENCOUNTER — Ambulatory Visit (INDEPENDENT_AMBULATORY_CARE_PROVIDER_SITE_OTHER): Payer: 59 | Admitting: *Deleted

## 2021-01-29 ENCOUNTER — Other Ambulatory Visit: Payer: Self-pay

## 2021-01-29 DIAGNOSIS — L501 Idiopathic urticaria: Secondary | ICD-10-CM | POA: Diagnosis not present

## 2021-01-29 DIAGNOSIS — L508 Other urticaria: Secondary | ICD-10-CM

## 2021-02-20 ENCOUNTER — Other Ambulatory Visit (HOSPITAL_COMMUNITY): Payer: Self-pay

## 2021-02-24 ENCOUNTER — Other Ambulatory Visit (HOSPITAL_COMMUNITY): Payer: Self-pay

## 2021-02-24 MED FILL — Omalizumab Subcutaneous Soln Prefilled Syringe 150 MG/ML: SUBCUTANEOUS | 28 days supply | Qty: 2 | Fill #2 | Status: AC

## 2021-02-25 ENCOUNTER — Other Ambulatory Visit: Payer: Self-pay

## 2021-02-25 ENCOUNTER — Other Ambulatory Visit (HOSPITAL_COMMUNITY): Payer: Self-pay

## 2021-02-25 ENCOUNTER — Telehealth: Payer: 59 | Admitting: Physician Assistant

## 2021-02-25 DIAGNOSIS — R3989 Other symptoms and signs involving the genitourinary system: Secondary | ICD-10-CM | POA: Diagnosis not present

## 2021-02-25 MED ORDER — CEPHALEXIN 500 MG PO CAPS
500.0000 mg | ORAL_CAPSULE | Freq: Two times a day (BID) | ORAL | 0 refills | Status: DC
  Filled 2021-02-25: qty 14, 7d supply, fill #0

## 2021-02-25 NOTE — Progress Notes (Signed)

## 2021-02-26 ENCOUNTER — Other Ambulatory Visit: Payer: Self-pay

## 2021-02-26 ENCOUNTER — Ambulatory Visit (INDEPENDENT_AMBULATORY_CARE_PROVIDER_SITE_OTHER): Payer: 59 | Admitting: *Deleted

## 2021-02-26 DIAGNOSIS — L501 Idiopathic urticaria: Secondary | ICD-10-CM | POA: Diagnosis not present

## 2021-02-26 DIAGNOSIS — L508 Other urticaria: Secondary | ICD-10-CM

## 2021-02-27 ENCOUNTER — Other Ambulatory Visit (HOSPITAL_COMMUNITY): Payer: Self-pay

## 2021-03-03 ENCOUNTER — Other Ambulatory Visit: Payer: Self-pay | Admitting: Family Medicine

## 2021-03-03 ENCOUNTER — Other Ambulatory Visit (HOSPITAL_COMMUNITY): Payer: Self-pay

## 2021-03-04 ENCOUNTER — Other Ambulatory Visit (HOSPITAL_COMMUNITY): Payer: Self-pay

## 2021-03-05 ENCOUNTER — Telehealth: Payer: 59 | Admitting: Physician Assistant

## 2021-03-05 DIAGNOSIS — E860 Dehydration: Secondary | ICD-10-CM

## 2021-03-05 DIAGNOSIS — R42 Dizziness and giddiness: Secondary | ICD-10-CM

## 2021-03-05 DIAGNOSIS — R197 Diarrhea, unspecified: Secondary | ICD-10-CM

## 2021-03-05 DIAGNOSIS — R531 Weakness: Secondary | ICD-10-CM

## 2021-03-05 NOTE — Progress Notes (Signed)
Based on what you shared with me, I feel your condition warrants further evaluation and I recommend that you be seen in a face to face office visit. Giving level of dizziness and weakness along with other symptoms, you need to be evaluated in person at least at local urgent care for examination, assessment of vital signs, hydration and likely lab work to make sure you get proper treatment.    NOTE: If you entered your credit card information for this eVisit, you will not be charged. You may see a "hold" on your card for the $35 but that hold will drop off and you will not have a charge processed.   If you are having a true medical emergency please call 911.      For an urgent face to face visit, [facility] has six urgent care centers for your convenience:     [facility] at [facility] Directions [phone] [address] [place], [place] [address] 8 am - 4 pm Monday - Friday    [facility]) Get Driving Directions [phone] [address] [place], [place] [address] 8 am to 8 pm Monday-Friday 10 am to 6 pm [facility] Center [facility]) Get Driving Directions [phone]  [address] [place],  [place]  [address] 8 am to 8 pm Monday-Friday 8 am to 4 pm [facility] Urgent Care at [facility] Get Driving Directions [phone] [address] [place], [place] [address] 8 am to 8 pm Monday-Friday 8 am to 4 pm [facility] Urgent Care at [facility] Get Driving Directions  [phone] [address] [place], [place] [address] 8 am to 8 pm Monday-Friday 8 am to 4 pm [facility] Urgent Care at [facility] Directions [phone] [address] [place], [place] [address] 8 am to 8 pm Monday-Friday 8 am to 4 pm Saturday-Sunday      Your MyChart E-visit questionnaire answers were reviewed by a board certified advanced clinical practitioner to complete your personal care plan based on your specific symptoms.  Thank you for using e-Visits.

## 2021-03-20 ENCOUNTER — Other Ambulatory Visit (HOSPITAL_COMMUNITY): Payer: Self-pay

## 2021-03-23 ENCOUNTER — Other Ambulatory Visit (HOSPITAL_COMMUNITY): Payer: Self-pay

## 2021-03-24 ENCOUNTER — Other Ambulatory Visit (HOSPITAL_COMMUNITY): Payer: Self-pay

## 2021-03-24 MED FILL — Omalizumab Subcutaneous Soln Prefilled Syringe 150 MG/ML: SUBCUTANEOUS | 28 days supply | Qty: 2 | Fill #3 | Status: AC

## 2021-03-26 ENCOUNTER — Ambulatory Visit (INDEPENDENT_AMBULATORY_CARE_PROVIDER_SITE_OTHER): Payer: 59 | Admitting: *Deleted

## 2021-03-26 ENCOUNTER — Other Ambulatory Visit: Payer: Self-pay

## 2021-03-26 DIAGNOSIS — L501 Idiopathic urticaria: Secondary | ICD-10-CM | POA: Diagnosis not present

## 2021-03-26 DIAGNOSIS — L508 Other urticaria: Secondary | ICD-10-CM

## 2021-03-31 ENCOUNTER — Other Ambulatory Visit (HOSPITAL_COMMUNITY): Payer: Self-pay

## 2021-03-31 ENCOUNTER — Other Ambulatory Visit (HOSPITAL_BASED_OUTPATIENT_CLINIC_OR_DEPARTMENT_OTHER): Payer: Self-pay

## 2021-03-31 ENCOUNTER — Telehealth: Payer: Self-pay | Admitting: *Deleted

## 2021-03-31 MED FILL — Norgestimate & Ethinyl Estradiol Tab 0.25 MG-35 MCG: ORAL | 63 days supply | Qty: 84 | Fill #1 | Status: CN

## 2021-03-31 NOTE — Telephone Encounter (Signed)
LM for patient to contact office for MD appt for Xolair reapproval 

## 2021-04-03 ENCOUNTER — Other Ambulatory Visit (HOSPITAL_BASED_OUTPATIENT_CLINIC_OR_DEPARTMENT_OTHER): Payer: Self-pay

## 2021-04-08 ENCOUNTER — Other Ambulatory Visit (HOSPITAL_COMMUNITY): Payer: Self-pay

## 2021-04-09 ENCOUNTER — Other Ambulatory Visit (HOSPITAL_BASED_OUTPATIENT_CLINIC_OR_DEPARTMENT_OTHER): Payer: Self-pay

## 2021-04-13 ENCOUNTER — Ambulatory Visit: Payer: 59 | Admitting: Family

## 2021-04-15 ENCOUNTER — Other Ambulatory Visit (HOSPITAL_COMMUNITY): Payer: Self-pay

## 2021-04-16 ENCOUNTER — Other Ambulatory Visit (HOSPITAL_COMMUNITY): Payer: Self-pay

## 2021-04-16 MED FILL — Omalizumab Subcutaneous Soln Prefilled Syringe 150 MG/ML: SUBCUTANEOUS | 28 days supply | Qty: 2 | Fill #4 | Status: AC

## 2021-04-22 ENCOUNTER — Encounter: Payer: Self-pay | Admitting: Family Medicine

## 2021-04-22 ENCOUNTER — Other Ambulatory Visit (HOSPITAL_COMMUNITY): Payer: Self-pay

## 2021-04-22 ENCOUNTER — Ambulatory Visit: Payer: 59 | Admitting: Family Medicine

## 2021-04-22 ENCOUNTER — Other Ambulatory Visit: Payer: Self-pay

## 2021-04-22 VITALS — BP 122/80 | HR 70 | Temp 98.2°F | Ht 64.0 in | Wt 185.4 lb

## 2021-04-22 DIAGNOSIS — J302 Other seasonal allergic rhinitis: Secondary | ICD-10-CM | POA: Insufficient documentation

## 2021-04-22 DIAGNOSIS — H1013 Acute atopic conjunctivitis, bilateral: Secondary | ICD-10-CM | POA: Insufficient documentation

## 2021-04-22 DIAGNOSIS — L508 Other urticaria: Secondary | ICD-10-CM | POA: Insufficient documentation

## 2021-04-22 DIAGNOSIS — J3089 Other allergic rhinitis: Secondary | ICD-10-CM

## 2021-04-22 DIAGNOSIS — L509 Urticaria, unspecified: Secondary | ICD-10-CM | POA: Insufficient documentation

## 2021-04-22 MED ORDER — CETIRIZINE HCL 10 MG PO TABS
10.0000 mg | ORAL_TABLET | Freq: Two times a day (BID) | ORAL | 5 refills | Status: AC
  Filled 2021-04-22: qty 60, 30d supply, fill #0

## 2021-04-22 MED ORDER — FAMOTIDINE 20 MG PO TABS
20.0000 mg | ORAL_TABLET | Freq: Two times a day (BID) | ORAL | 5 refills | Status: DC
  Filled 2021-04-22: qty 60, 30d supply, fill #0
  Filled 2021-06-24: qty 60, 30d supply, fill #1
  Filled 2021-10-14: qty 60, 30d supply, fill #2
  Filled 2021-12-17: qty 60, 30d supply, fill #3
  Filled 2022-02-19: qty 60, 30d supply, fill #4

## 2021-04-22 MED FILL — Norgestimate & Ethinyl Estradiol Tab 0.25 MG-35 MCG: ORAL | 63 days supply | Qty: 84 | Fill #1 | Status: AC

## 2021-04-22 NOTE — Patient Instructions (Signed)
Allergic rhinitis Continue allergen avoidance measures directed toward grass pollen, weed pollen, tree pollen, dust mite, mold, cockroach, and dog as listed below Continue cetirizine 10 mg once a day as needed for runny nose or itch Continue Flonase 2 sprays in each nostril once a day as needed for stuffy nose. In the right nostril, point the applicator out toward the right ear. In the left nostril, point the applicator out toward the left ear Consider saline nasal rinses as needed for nasal symptoms. Use this before any medicated nasal sprays for best result  Allergic conjunctivitis Continue olopatadine once a day as needed for red or itchy eyes  Hives (urticaria) Use the least amount of medications while remaining hive free Cetirizine (Zyrtec) 10mg  twice a day and famotidine (Pepcid) 20 mg twice a day. If no symptoms for 7-14 days then decrease to. Cetirizine (Zyrtec) 10mg  twice a day and famotidine (Pepcid) 20 mg once a day.  If no symptoms for 7-14 days then decrease to. Cetirizine (Zyrtec) 10mg  twice a day.  If no symptoms for 7-14 days then decrease to. Cetirizine (Zyrtec) 10mg  once a day.  May use Benadryl (diphenhydramine) as needed for breakthrough hives       If symptoms return, then step up dosage Keep a detailed symptom journal including foods eaten, contact with allergens, medications taken, weather changes.  Continue Xolair injections once every 28 days and have access to an epinephrine auto-injector set.   Call the clinic if this treatment plan is not working well for you.  Follow up in 6 months or sooner if needed.  Reducing Pollen Exposure The American Academy of Allergy, Asthma and Immunology suggests the following steps to reduce your exposure to pollen during allergy seasons. Do not hang sheets or clothing out to dry; pollen may collect on these items. Do not mow lawns or spend time around freshly cut grass; mowing stirs up pollen. Keep windows closed at night.  Keep  car windows closed while driving. Minimize morning activities outdoors, a time when pollen counts are usually at their highest. Stay indoors as much as possible when pollen counts or humidity is high and on windy days when pollen tends to remain in the air longer. Use air conditioning when possible.  Many air conditioners have filters that trap the pollen spores. Use a HEPA room air filter to remove pollen form the indoor air you breathe.   Control of Dust Mite Allergen Dust mites play a major role in allergic asthma and rhinitis. They occur in environments with high humidity wherever human skin is found. Dust mites absorb humidity from the atmosphere (ie, they do not drink) and feed on organic matter (including shed human and animal skin). Dust mites are a microscopic type of insect that you cannot see with the naked eye. High levels of dust mites have been detected from mattresses, pillows, carpets, upholstered furniture, bed covers, clothes, soft toys and any woven material. The principal allergen of the dust mite is found in its feces. A gram of dust may contain 1,000 mites and 250,000 fecal particles. Mite antigen is easily measured in the air during house cleaning activities. Dust mites do not bite and do not cause harm to humans, other than by triggering allergies/asthma.  Ways to decrease your exposure to dust mites in your home:  1. Encase mattresses, box springs and pillows with a mite-impermeable barrier or cover  2. Wash sheets, blankets and drapes weekly in hot water (130 F) with detergent and dry them in  a dryer on the hot setting.  3. Have the room cleaned frequently with a vacuum cleaner and a damp dust-mop. For carpeting or rugs, vacuuming with a vacuum cleaner equipped with a high-efficiency particulate air (HEPA) filter. The dust mite allergic individual should not be in a room which is being cleaned and should wait 1 hour after cleaning before going into the room.  4. Do not  sleep on upholstered furniture (eg, couches).  5. If possible removing carpeting, upholstered furniture and drapery from the home is ideal. Horizontal blinds should be eliminated in the rooms where the person spends the most time (bedroom, study, television room). Washable vinyl, roller-type shades are optimal.  6. Remove all non-washable stuffed toys from the bedroom. Wash stuffed toys weekly like sheets and blankets above.  7. Reduce indoor humidity to less than 50%. Inexpensive humidity monitors can be purchased at most hardware stores. Do not use a humidifier as can make the problem worse and are not recommended.  Control of Dog or Cat Allergen Avoidance is the best way to manage a dog or cat allergy. If you have a dog or cat and are allergic to dog or cats, consider removing the dog or cat from the home. If you have a dog or cat but don't want to find it a new home, or if your family wants a pet even though someone in the household is allergic, here are some strategies that may help keep symptoms at bay:  Keep the pet out of your bedroom and restrict it to only a few rooms. Be advised that keeping the dog or cat in only one room will not limit the allergens to that room. Don't pet, hug or kiss the dog or cat; if you do, wash your hands with soap and water. High-efficiency particulate air (HEPA) cleaners run continuously in a bedroom or living room can reduce allergen levels over time. Regular use of a high-efficiency vacuum cleaner or a central vacuum can reduce allergen levels. Giving your dog or cat a bath at least once a week can reduce airborne allergen.  Control of Cockroach Allergen  Cockroach allergen has been identified as an important cause of acute attacks of asthma, especially in urban settings.  There are fifty-five species of cockroach that exist in the [place], however only three, the [place], [place] species produce allergen that can affect patients with  Asthma.  Allergens can be obtained from fecal particles, egg casings and secretions from cockroaches.    Remove food sources. Reduce access to water. Seal access and entry points. Spray runways with 0.5-1% Diazinon or Chlorpyrifos Blow boric acid power under stoves and refrigerator. Place bait stations (hydramethylnon) at feeding sites.

## 2021-04-22 NOTE — Progress Notes (Signed)
[address] [place] [address] Dept: [phone]  FOLLOW UP NOTE  Patient ID: Crystal Simpson, female    DOB: 12/22/1990  Age: 30 y.o. MRN: [id number] Date of Office Visit: 04/22/2021  Assessment  Chief Complaint: Urticaria (Currently on Xolair. Xolair is works now that she has had 6 treatments. Has decreased her medications only takes Zyrtec daily.)  HPI Crystal Simpson is a 30 year old female who presents to the clinic for follow-up visit.  Last seen in this clinic on 11/20/2020 by Dr. Delorse Lek for evaluation of allergic rhinitis, allergic conjunctivitis, and chronic urticaria. At today's visit, she reports her allergic rhinitis has been well controlled with occasional postnasal drainage for which she continues cetirizine 10 mg once a day.  She is not currently using Flonase or nasal saline rinse.  Allergic conjunctivitis is reported as well controlled with no current medical intervention.  Chronic urticaria is reported as much more well controlled since her last visit to this clinic.  She reports that she was continuing to experience hive breakouts for the first 3 months however, she reports that she has not had a hive breakout in about 2 months.  She does report that she continues to experience mild pruritus just before her Xolair injection is due.  She continues cetirizine 10 mg once a day and occasionally takes famotidine when she experiences pruritus.  She is not currently taking montelukast.  She does report that during the last 2 months she has developed to small patches on her left arm involving tiny flesh-colored raised bumps and dry skin that are not itchy.  She reports that she continues a daily moisturizing routine and these areas are beginning to resolve.  Her current medications are listed in the chart.   Drug Allergies:  Allergies  Allergen Reactions   Eggs Or Egg-Derived Products Hives   Shrimp [Shellfish Allergy]     Unknown reaction   Wheat Bran     Unknown  reaction     Physical Exam: BP 122/80   Pulse 70   Temp 98.2 F (36.8 C) (Temporal)   Ht 5\' 4"  (1.626 m)   Wt 185 lb 6.4 oz (84.1 kg)   SpO2 99%   BMI 31.82 kg/m    Physical Exam Vitals reviewed.  Constitutional:      Appearance: Normal appearance.  HENT:     Head: Normocephalic and atraumatic.     Right Ear: Tympanic membrane normal.     Left Ear: Tympanic membrane normal.     Nose:     Comments: Bilateral nares slightly erythematous with clear nasal drainage noted.  Pharynx normal.  Ears normal.  Eyes normal.    Mouth/Throat:     Pharynx: Oropharynx is clear.  Eyes:     Conjunctiva/sclera: Conjunctivae normal.  Cardiovascular:     Rate and Rhythm: Normal rate and regular rhythm.     Heart sounds: Normal heart sounds. No murmur heard. Pulmonary:     Effort: Pulmonary effort is normal.     Breath sounds: Normal breath sounds.     Comments: Lungs clear to auscultation Musculoskeletal:     Cervical back: Normal range of motion and neck supple.  Skin:    General: Skin is warm.     Comments: Left bicep and left shoulder with small patch of raised flesh-colored bumps.  The skin looks very dry in these areas.  No open areas or drainage noted  Neurological:     Mental Status: She is alert and oriented to person,  place, and time.  Psychiatric:        Mood and Affect: Mood normal.        Behavior: Behavior normal.        Thought Content: Thought content normal.        Judgment: Judgment normal.    Assessment and Plan: 1. Chronic urticaria   2. Allergic conjunctivitis of both eyes   3. Seasonal and perennial allergic rhinitis     Meds ordered this encounter  Medications   cetirizine (ZYRTEC) 10 MG tablet    Sig: Take 1 tablet (10 mg total) by mouth 2 (two) times daily.    Dispense:  60 tablet    Refill:  5   famotidine (PEPCID) 20 MG tablet    Sig: Take 1 tablet (20 mg total) by mouth 2 (two) times daily.    Dispense:  60 tablet    Refill:  5     Patient  Instructions  Allergic rhinitis Continue allergen avoidance measures directed toward grass pollen, weed pollen, tree pollen, dust mite, mold, cockroach, and dog as listed below Continue cetirizine 10 mg once a day as needed for runny nose or itch Continue Flonase 2 sprays in each nostril once a day as needed for stuffy nose. In the right nostril, point the applicator out toward the right ear. In the left nostril, point the applicator out toward the left ear Consider saline nasal rinses as needed for nasal symptoms. Use this before any medicated nasal sprays for best result  Allergic conjunctivitis Continue olopatadine once a day as needed for red or itchy eyes  Hives (urticaria) Use the least amount of medications while remaining hive free Cetirizine (Zyrtec) 10mg  twice a day and famotidine (Pepcid) 20 mg twice a day. If no symptoms for 7-14 days then decrease to. Cetirizine (Zyrtec) 10mg  twice a day and famotidine (Pepcid) 20 mg once a day.  If no symptoms for 7-14 days then decrease to. Cetirizine (Zyrtec) 10mg  twice a day.  If no symptoms for 7-14 days then decrease to. Cetirizine (Zyrtec) 10mg  once a day.  May use Benadryl (diphenhydramine) as needed for breakthrough hives       If symptoms return, then step up dosage Keep a detailed symptom journal including foods eaten, contact with allergens, medications taken, weather changes.  Continue Xolair injections once every 28 days and have access to an epinephrine auto-injector set.   Call the clinic if this treatment plan is not working well for you.  Follow up in 6 months or sooner if needed.   Return in about 6 months (around 10/23/2021), or if symptoms worsen or fail to improve.    Thank you for the opportunity to care for this patient.  Please do not hesitate to contact me with questions.  , FNP [facility]

## 2021-04-23 ENCOUNTER — Other Ambulatory Visit (HOSPITAL_COMMUNITY): Payer: Self-pay

## 2021-04-23 ENCOUNTER — Ambulatory Visit (INDEPENDENT_AMBULATORY_CARE_PROVIDER_SITE_OTHER): Payer: 59 | Admitting: *Deleted

## 2021-04-23 DIAGNOSIS — L501 Idiopathic urticaria: Secondary | ICD-10-CM | POA: Diagnosis not present

## 2021-04-23 DIAGNOSIS — L508 Other urticaria: Secondary | ICD-10-CM

## 2021-04-27 ENCOUNTER — Other Ambulatory Visit (HOSPITAL_COMMUNITY): Payer: Self-pay

## 2021-04-28 ENCOUNTER — Telehealth: Payer: Self-pay

## 2021-04-28 NOTE — Progress Notes (Signed)
Virtual Visit via Telephone Note  I connected with Crystal Simpson, on 04/29/2021 at 4:02 PM by telephone due to the COVID-19 pandemic and verified that I am speaking with the correct person using two identifiers.  Due to current restrictions/limitations of in-office visits due to the COVID-19 pandemic, this scheduled clinical appointment was converted to a telehealth visit.   Consent: I discussed the limitations, risks, security and privacy concerns of performing an evaluation and management service by telephone and the availability of in person appointments. I also discussed with the patient that there may be a patient responsible charge related to this service. The patient expressed understanding and agreed to proceed.   Location of Patient: Home  Location of Provider: [facility]   Persons participating in Telemedicine visit: Crystal Simpson Ricky Stabs, NP Margorie John, CMA   History of Present Illness: Crystal Simpson is a 30 year-old female who presents to establish care.   Current issues and/or concerns: None    Past Medical History:  Diagnosis Date   Urticaria    Allergies  Allergen Reactions   Eggs Or Egg-Derived Products Hives   Shrimp [Shellfish Allergy]     Unknown reaction   Wheat Bran     Unknown reaction     Current Outpatient Medications on File Prior to Visit  Medication Sig Dispense Refill   cetirizine (ZYRTEC) 10 MG tablet Take 1 tablet (10 mg total) by mouth 2 (two) times daily. 60 tablet 5   famotidine (PEPCID) 20 MG tablet Take 1 tablet (20 mg total) by mouth 2 (two) times daily. 60 tablet 5   norgestimate-ethinyl estradiol (ORTHO-CYCLEN) 0.25-35 MG-MCG tablet TAKE 1 TABLET BY MOUTH DAILY. SKIP PLACEBO PILLS AND RESTART A NEW PACK EVERY 21 DAYS. 84 tablet 4   omalizumab (XOLAIR) 150 MG/ML prefilled syringe INJECT 2 PENS (300 MG) INTO THE SKIN EVERY 28 DAYS. 2 mL 11   AUVI-Q 0.3 MG/0.3ML SOAJ injection      MODERNA  COVID-19 VACCINE 100 MCG/0.5ML injection      Current Facility-Administered Medications on File Prior to Visit  Medication Dose Route Frequency Provider Last Rate Last Admin   omalizumab Geoffry Paradise) injection 300 mg  300 mg Subcutaneous Q28 days Marcelyn Bruins, MD   300 mg at 04/23/21 1520    Observations/Objective: Alert and oriented x 3. Not in acute distress. Physical examination not completed as this is a telemedicine visit.  Assessment and Plan: 1. Encounter to establish care: - Patient presents today to establish care.  - Return for annual physical examination, labs, and health maintenance. Arrive fasting meaning having no food for at least 8 hours prior to appointment. You may have only water or black coffee. Please take scheduled medications as normal.   Follow Up Instructions: Return for annual physical exam.    Patient was given clear instructions to go to Emergency Department or return to medical center if symptoms don't improve, worsen, or new problems develop.The patient verbalized understanding.  I discussed the assessment and treatment plan with the patient. The patient was provided an opportunity to ask questions and all were answered. The patient agreed with the plan and demonstrated an understanding of the instructions.   The patient was advised to call back or seek an in-person evaluation if the symptoms worsen or if the condition fails to improve as anticipated.    I provided 5 minutes total of non-face-to-face time during this encounter.   Lowry Bala Jodi Geralds, NP  [facility]  [facility] at [facility] [place], [place] [phone] 04/29/2021, 4:02 PM

## 2021-04-28 NOTE — Telephone Encounter (Signed)
.  Ms. Crystal Simpson, Crystal Simpson are scheduled for a virtual visit with your provider today.    Just as we do with appointments in the office, we must obtain your consent to participate.  Your consent will be active for this visit and any virtual visit you may have with one of our providers in the next 365 days.    If you have a MyChart account, I can also send a copy of this consent to you electronically.  All virtual visits are billed to your insurance company just like a traditional visit in the office.  As this is a virtual visit, video technology does not allow for your provider to perform a traditional examination.  This may limit your provider's ability to fully assess your condition.  If your provider identifies any concerns that need to be evaluated in person or the need to arrange testing such as labs, EKG, etc, we will make arrangements to do so.    Although advances in technology are sophisticated, we cannot ensure that it will always work on either your end or our end.  If the connection with a video visit is poor, we may have to switch to a telephone visit.  With either a video or telephone visit, we are not always able to ensure that we have a secure connection.   I need to obtain your verbal consent now.   Are you willing to proceed with your visit today?   Crystal Simpson has provided verbal consent on 04/28/2021 for a virtual visit (video or telephone).   Crystal Simpson 04/28/2021  2:08 PM

## 2021-04-29 ENCOUNTER — Encounter: Payer: Self-pay | Admitting: Family

## 2021-04-29 ENCOUNTER — Telehealth (INDEPENDENT_AMBULATORY_CARE_PROVIDER_SITE_OTHER): Payer: 59 | Admitting: Family

## 2021-04-29 ENCOUNTER — Other Ambulatory Visit: Payer: Self-pay

## 2021-04-29 DIAGNOSIS — Z7689 Persons encountering health services in other specified circumstances: Secondary | ICD-10-CM | POA: Diagnosis not present

## 2021-04-29 NOTE — Progress Notes (Signed)
Pt presents for telemedicine visit to establish care °

## 2021-05-20 ENCOUNTER — Other Ambulatory Visit (HOSPITAL_COMMUNITY): Payer: Self-pay

## 2021-05-20 MED FILL — Omalizumab Subcutaneous Soln Prefilled Syringe 150 MG/ML: SUBCUTANEOUS | 28 days supply | Qty: 2 | Fill #5 | Status: AC

## 2021-05-21 ENCOUNTER — Ambulatory Visit: Payer: 59

## 2021-06-03 ENCOUNTER — Ambulatory Visit (INDEPENDENT_AMBULATORY_CARE_PROVIDER_SITE_OTHER): Payer: 59

## 2021-06-03 ENCOUNTER — Other Ambulatory Visit: Payer: Self-pay

## 2021-06-03 DIAGNOSIS — L501 Idiopathic urticaria: Secondary | ICD-10-CM

## 2021-06-11 ENCOUNTER — Other Ambulatory Visit (HOSPITAL_COMMUNITY): Payer: Self-pay

## 2021-06-11 MED FILL — Omalizumab Subcutaneous Soln Prefilled Syringe 150 MG/ML: SUBCUTANEOUS | 28 days supply | Qty: 2 | Fill #6 | Status: AC

## 2021-06-21 NOTE — Progress Notes (Signed)
Patient ID: Crystal Simpson, female    DOB: 1991-06-27  MRN: [id number]  CC: Annual Physical Exam  Subjective: Crystal Simpson is a 30 y.o. female who presents for annual physical exam.   Her concerns today include:  Reports home blood pressures are 110's/80's and concern for today's blood pressure being higher than normal.   Patient Active Problem List   Diagnosis Date Noted   Hives 04/22/2021   Allergic conjunctivitis of both eyes 04/22/2021   Seasonal and perennial allergic rhinitis 04/22/2021     Current Outpatient Medications on File Prior to Visit  Medication Sig Dispense Refill   AUVI-Q 0.3 MG/0.3ML SOAJ injection      cetirizine (ZYRTEC) 10 MG tablet Take 1 tablet (10 mg total) by mouth 2 (two) times daily. 60 tablet 5   famotidine (PEPCID) 20 MG tablet Take 1 tablet (20 mg total) by mouth 2 (two) times daily. 60 tablet 5   norgestimate-ethinyl estradiol (ORTHO-CYCLEN) 0.25-35 MG-MCG tablet TAKE 1 TABLET BY MOUTH DAILY. SKIP PLACEBO PILLS AND RESTART A NEW PACK EVERY 21 DAYS. 84 tablet 4   omalizumab (XOLAIR) 150 MG/ML prefilled syringe INJECT 2 PENS (300 MG) INTO THE SKIN EVERY 28 DAYS. 2 mL 11   Current Facility-Administered Medications on File Prior to Visit  Medication Dose Route Frequency Provider Last Rate Last Admin   omalizumab Arvid Right) injection 300 mg  300 mg Subcutaneous Q28 days Kennith Gain, MD   300 mg at 06/03/21 1612    Allergies  Allergen Reactions   Eggs Or Egg-Derived Products Hives   Shrimp [Shellfish Allergy]     Unknown reaction   Wheat Bran     Unknown reaction     Social History   Socioeconomic History   Marital status: Married    Spouse name: Not on file   Number of children: Not on file   Years of education: Not on file   Highest education level: Not on file  Occupational History   Occupation: nurse  Tobacco Use   Smoking status: Never   Smokeless tobacco: Never  Vaping Use   Vaping Use: Never used  Substance and  Sexual Activity   Alcohol use: Yes    Comment: occ   Drug use: No   Sexual activity: Yes    Birth control/protection: I.U.D.  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[employer]   Social Determinants of Health   Financial Resource Strain: Not on file  Food Insecurity: Not on file  Transportation Needs: Not on file  Physical Activity: Not on file  Stress: Not on file  Social Connections: Not on file  Intimate Partner Violence: Not on file    Family History  Problem Relation Age of Onset   Rheum arthritis Mother    Eczema Mother    Healthy Father    Healthy Sister    Healthy Brother    Healthy Daughter    Diabetes Maternal Grandmother    Hypertension Maternal Grandfather    Parkinson's disease Paternal Grandmother     Past Surgical History:  Procedure Laterality Date   CESAREAN SECTION     WISDOM TOOTH EXTRACTION      ROS: Review of Systems Negative except as stated above  PHYSICAL EXAM: BP 125/85 (BP Location: Left Arm, Patient Position: Sitting, Cuff Size: Large)   Pulse 79   Temp 98.3 F (36.8 C)   Resp 18   Ht 5' 4.02" (1.626 m)  Wt 184 lb 12.8 oz (83.8 kg)   SpO2 98%   BMI 31.70 kg/m    Physical Exam HENT:     Head: Normocephalic and atraumatic.     Right Ear: Tympanic membrane, ear canal and external ear normal.     Left Ear: Tympanic membrane, ear canal and external ear normal.  Eyes:     Extraocular Movements: Extraocular movements intact.     Conjunctiva/sclera: Conjunctivae normal.     Pupils: Pupils are equal, round, and reactive to light.  Cardiovascular:     Rate and Rhythm: Normal rate and regular rhythm.     Pulses: Normal pulses.     Heart sounds: Normal heart sounds.  Pulmonary:     Effort: Pulmonary effort is normal.     Breath sounds: Normal breath sounds.  Chest:     Comments: Patient declined exam. Abdominal:     General: Bowel sounds are normal.     Palpations: Abdomen is soft.   Genitourinary:    Comments: Patient declined exam.  Musculoskeletal:        General: Normal range of motion.     Cervical back: Normal range of motion and neck supple.  Skin:    General: Skin is warm and dry.     Capillary Refill: Capillary refill takes less than 2 seconds.  Neurological:     General: No focal deficit present.     Mental Status: She is alert and oriented to person, place, and time.  Psychiatric:        Mood and Affect: Mood normal.        Behavior: Behavior normal.    ASSESSMENT AND PLAN: 1. Annual physical exam: - Counseled on 150 minutes of exercise per week as tolerated, healthy eating (including decreased daily intake of saturated fats, cholesterol, added sugars, sodium), STI prevention, and routine healthcare maintenance. - Counseled patient that blood pressure today in office is normal. Counseled to check blood pressures in the home at least 3 to 4 days weekly and to follow-up if blood pressures are higher than 120's-130's/80's patient verbalized understanding.   2. Screening for metabolic disorder: - CVE93+YBOF to check kidney function, liver function, and electrolyte balance.  - CMP14+EGFR  3. Screening for deficiency anemia: - CBC to screen for anemia. - CBC  4. Diabetes mellitus screening: - Hemoglobin A1c to screen for pre-diabetes/diabetes. - Hemoglobin A1c  5. Screening cholesterol level: - Lipid panel to screen for high cholesterol.  - Lipid panel  6. Thyroid disorder screen: - TSH to check thyroid function.  - TSH  7. Need for hepatitis C screening test: - Hepatitis C antibody to screen for hepatitis C.  - Hepatitis C Antibody    Patient was given the opportunity to ask questions.  Patient verbalized understanding of the plan and was able to repeat key elements of the plan. Patient was given clear instructions to go to Emergency Department or return to medical center if symptoms don't improve, worsen, or new problems develop.The patient  verbalized understanding.   Orders Placed This Encounter  Procedures   Hepatitis C Antibody   CBC   Lipid panel   TSH   CMP14+EGFR   Hemoglobin A1c     Requested Prescriptions    No prescriptions requested or ordered in this encounter    Return in about 1 year (around 06/24/2022) for Physical per patient preference.  Camillia Herter, NP

## 2021-06-24 ENCOUNTER — Other Ambulatory Visit (HOSPITAL_COMMUNITY): Payer: Self-pay

## 2021-06-24 ENCOUNTER — Other Ambulatory Visit: Payer: Self-pay

## 2021-06-24 ENCOUNTER — Ambulatory Visit (INDEPENDENT_AMBULATORY_CARE_PROVIDER_SITE_OTHER): Payer: 59 | Admitting: Family

## 2021-06-24 ENCOUNTER — Encounter: Payer: Self-pay | Admitting: Family

## 2021-06-24 VITALS — BP 125/85 | HR 79 | Temp 98.3°F | Resp 18 | Ht 64.02 in | Wt 184.8 lb

## 2021-06-24 DIAGNOSIS — Z130 Encounter for screening for diseases of the blood and blood-forming organs and certain disorders involving the immune mechanism: Secondary | ICD-10-CM | POA: Diagnosis not present

## 2021-06-24 DIAGNOSIS — Z1159 Encounter for screening for other viral diseases: Secondary | ICD-10-CM | POA: Diagnosis not present

## 2021-06-24 DIAGNOSIS — Z13220 Encounter for screening for lipoid disorders: Secondary | ICD-10-CM | POA: Diagnosis not present

## 2021-06-24 DIAGNOSIS — Z1329 Encounter for screening for other suspected endocrine disorder: Secondary | ICD-10-CM

## 2021-06-24 DIAGNOSIS — Z13228 Encounter for screening for other metabolic disorders: Secondary | ICD-10-CM | POA: Diagnosis not present

## 2021-06-24 DIAGNOSIS — Z131 Encounter for screening for diabetes mellitus: Secondary | ICD-10-CM

## 2021-06-24 DIAGNOSIS — Z0000 Encounter for general adult medical examination without abnormal findings: Secondary | ICD-10-CM | POA: Diagnosis not present

## 2021-06-24 MED FILL — Norgestimate & Ethinyl Estradiol Tab 0.25 MG-35 MCG: ORAL | 63 days supply | Qty: 84 | Fill #2 | Status: AC

## 2021-06-24 NOTE — Progress Notes (Signed)
Pt presents for annual physical exam, has no other concerns.

## 2021-06-24 NOTE — Patient Instructions (Signed)
Preventive Care 21-31 Years Old, Female Preventive care refers to lifestyle choices and visits with your health care provider that can promote health and wellness. This includes: A yearly physical exam. This is also called an annual wellness visit. Regular dental and eye exams. Immunizations. Screening for certain conditions. Healthy lifestyle choices, such as: Eating a healthy diet. Getting regular exercise. Not using drugs or products that contain nicotine and tobacco. Limiting alcohol use. What can I expect for my preventive care visit? Physical exam Your health care provider may check your: Height and weight. These may be used to calculate your BMI (body mass index). BMI is a measurement that tells if you are at a healthy weight. Heart rate and blood pressure. Body temperature. Skin for abnormal spots. Counseling Your health care provider may ask you questions about your: Past medical problems. Family's medical history. Alcohol, tobacco, and drug use. Emotional well-being. Home life and relationship well-being. Sexual activity. Diet, exercise, and sleep habits. Work and work environment. Access to firearms. Method of birth control. Menstrual cycle. Pregnancy history. What immunizations do I need? Vaccines are usually given at various ages, according to a schedule. Your health care provider will recommend vaccines for you based on your age, medical history, and lifestyle or other factors, such as travel or where you work. What tests do I need? Blood tests Lipid and cholesterol levels. These may be checked every 5 years starting at age 20. Hepatitis C test. Hepatitis B test. Screening Diabetes screening. This is done by checking your blood sugar (glucose) after you have not eaten for a while (fasting). STD (sexually transmitted disease) testing, if you are at risk. BRCA-related cancer screening. This may be done if you have a family history of breast, ovarian, tubal, or  peritoneal cancers. Pelvic exam and Pap test. This may be done every 3 years starting at age 21. Starting at age 30, this may be done every 5 years if you have a Pap test in combination with an HPV test. Talk with your health care provider about your test results, treatment options, and if necessary, the need for more tests. Follow these instructions at home: Eating and drinking  Eat a healthy diet that includes fresh fruits and vegetables, whole grains, lean protein, and low-fat dairy products. Take vitamin and mineral supplements as recommended by your health care provider. Do not drink alcohol if: Your health care provider tells you not to drink. You are pregnant, may be pregnant, or are planning to become pregnant. If you drink alcohol: Limit how much you have to 0-1 drink a day. Be aware of how much alcohol is in your drink. In the [place] one drink equals one 12 oz bottle of beer (355 mL), one 5 oz glass of wine (148 mL), or one 1 oz glass of hard liquor (44 mL). Lifestyle Take daily care of your teeth and gums. Brush your teeth every morning and night with fluoride toothpaste. Floss one time each day. Stay active. Exercise for at least 30 minutes 5 or more days each week. Do not use any products that contain nicotine or tobacco, such as cigarettes, e-cigarettes, and chewing tobacco. If you need help quitting, ask your health care provider. Do not use drugs. If you are sexually active, practice safe sex. Use a condom or other form of protection to prevent STIs (sexually transmitted infections). If you do not wish to become pregnant, use a form of birth control. If you plan to become pregnant, see your health care provider   for a prepregnancy visit. Find healthy ways to cope with stress, such as: Meditation, yoga, or listening to music. Journaling. Talking to a trusted person. Spending time with friends and family. Safety Always wear your seat belt while driving or riding in a  vehicle. Do not drive: If you have been drinking alcohol. Do not ride with someone who has been drinking. When you are tired or distracted. While texting. Wear a helmet and other protective equipment during sports activities. If you have firearms in your house, make sure you follow all gun safety procedures. Seek help if you have been physically or sexually abused. What's next? Go to your health care provider once a year for an annual wellness visit. Ask your health care provider how often you should have your eyes and teeth checked. Stay up to date on all vaccines. This information is not intended to replace advice given to you by your health care provider. Make sure you discuss any questions you have with your health care provider. Document Revised: 11/21/2020 Document Reviewed: 05/25/2018 Elsevier Patient Education  2022 Elsevier Inc.  

## 2021-06-25 ENCOUNTER — Telehealth: Payer: Self-pay | Admitting: Hematology and Oncology

## 2021-06-25 ENCOUNTER — Other Ambulatory Visit: Payer: Self-pay | Admitting: Family

## 2021-06-25 DIAGNOSIS — D72829 Elevated white blood cell count, unspecified: Secondary | ICD-10-CM

## 2021-06-25 LAB — CMP14+EGFR
ALT: 6 IU/L (ref 0–32)
AST: 13 IU/L (ref 0–40)
Albumin/Globulin Ratio: 1.5 (ref 1.2–2.2)
Albumin: 4.2 g/dL (ref 3.9–5.0)
Alkaline Phosphatase: 50 IU/L (ref 44–121)
BUN/Creatinine Ratio: 11 (ref 9–23)
BUN: 7 mg/dL (ref 6–20)
Bilirubin Total: 0.4 mg/dL (ref 0.0–1.2)
CO2: 22 mmol/L (ref 20–29)
Calcium: 9.4 mg/dL (ref 8.7–10.2)
Chloride: 103 mmol/L (ref 96–106)
Creatinine, Ser: 0.64 mg/dL (ref 0.57–1.00)
Globulin, Total: 2.8 g/dL (ref 1.5–4.5)
Glucose: 76 mg/dL (ref 70–99)
Potassium: 4.2 mmol/L (ref 3.5–5.2)
Sodium: 139 mmol/L (ref 134–144)
Total Protein: 7 g/dL (ref 6.0–8.5)
eGFR: 122 mL/min/{1.73_m2} (ref >59)

## 2021-06-25 LAB — LIPID PANEL
Chol/HDL Ratio: 3.4 ratio (ref 0.0–4.4)
Cholesterol, Total: 202 mg/dL — ABNORMAL HIGH (ref 100–199)
HDL: 59 mg/dL (ref >39)
LDL Chol Calc (NIH): 126 mg/dL — ABNORMAL HIGH (ref 0–99)
Triglycerides: 96 mg/dL (ref 0–149)
VLDL Cholesterol Cal: 17 mg/dL (ref 5–40)

## 2021-06-25 LAB — CBC
Hematocrit: 36.5 % (ref 34.0–46.6)
Hemoglobin: 11.6 g/dL (ref 11.1–15.9)
MCH: 28 pg (ref 26.6–33.0)
MCHC: 31.8 g/dL (ref 31.5–35.7)
MCV: 88 fL (ref 79–97)
Platelets: 360 10*3/uL (ref 150–450)
RBC: 4.14 x10E6/uL (ref 3.77–5.28)
RDW: 12.3 % (ref 11.7–15.4)
WBC: 16.3 10*3/uL — ABNORMAL HIGH (ref 3.4–10.8)

## 2021-06-25 LAB — HEPATITIS C ANTIBODY: Hep C Virus Ab: <0.1 s/co ratio (ref 0.0–0.9)

## 2021-06-25 LAB — HEMOGLOBIN A1C
Est. average glucose Bld gHb Est-mCnc: 103 mg/dL
Hgb A1c MFr Bld: 5.2 % (ref 4.8–5.6)

## 2021-06-25 LAB — TSH: TSH: 1.16 u[IU]/mL (ref 0.450–4.500)

## 2021-06-25 NOTE — Telephone Encounter (Signed)
Scheduled appt per 9/29 referral. Pt is aware of appt date and time.  

## 2021-06-25 NOTE — Progress Notes (Signed)
Kidney function normal.   Liver function normal.   Thyroid function normal.   No diabetes.   No anemia.   Hepatitis C negative.   Cholesterol mildly higher than expected. High cholesterol may increase risk of heart attack and/or stroke. Consider eating more fruits, vegetables, and lean baked meats such as chicken or fish. Moderate intensity exercise at least 150 minutes as tolerated per week may help as well. However, your risk of heart attack/stroke in ten years is less than average risk so does not need to start a medication at the moment. Encouraged to recheck cholesterol in 3 to 6 months at lab only appointment. Please call our office to schedule this.  White blood cells (sometimes called "infection fighters") are higher than normal. Upon review of white blood cells history they have been elevated and increasing since 2020. Referral to Hematology for further evaluation and management. Their office should call patient within 2 weeks with appointment details. Please note patient may receive a call from the Cancer Center at [facility] as this is where the Hematology office is located.

## 2021-06-26 ENCOUNTER — Encounter: Payer: 59 | Admitting: Hematology and Oncology

## 2021-06-26 ENCOUNTER — Other Ambulatory Visit: Payer: 59

## 2021-06-30 NOTE — Progress Notes (Signed)
[place] [phone]   Fax:([phone]) [place] NOTE  Patient Care Team: Camillia Herter, NP as PCP - General (Nurse Practitioner)  Hematological/Oncological History # Leukocytosis 11/21/2015: WBC 14.5, Hgb 12.7, MCV 90, Plt 289 07/11/2019: WBC 14.6, Hgb 12.5, MCV 87, Plt 306 08/08/2019: WBC 13.1, Hgb 11.9, MCV 89, Plt 321. [place] 8000. ESR 36 02/05/2020: WBC 12.7, Hgb 11.2, Plt 287 06/24/2021: WBC 16.3, Hgb 11.6, Plt 360, MCV 88 07/01/2021: establish care with Dr. Lorenso Courier   CHIEF COMPLAINTS/PURPOSE OF CONSULTATION:  "Leukocytosis "  HISTORY OF PRESENTING ILLNESS:  Crystal Simpson 30 y.o. female with medical history significant for urticaria who presents for evaluation of leukocytosis.   On review of the previous records Crystal Simpson has had a history of leukocytosis dating back to at least 11/01/2015.  This is far back as her records go.  That time she had a white blood cell count of 14.5 with a neutrophilic predominance.  She has consistently had white blood cell counts elevated greater than 12.  Recently on 06/25/2019 the patient had a white blood cell count of 16.3 with no differential.  She had a separate reading from 2020 as well as 2021 which were elevated.  Due to concern for these findings the patient was referred to hematology for further evaluation management.  On exam today Crystal Simpson notes that she is otherwise healthy with the exception of issues with spontaneous hives.  She notes this was diet noticed in 2020.  She reports that she is following with asthma and allergy specialist who prescribed her numerous antihistamine medications in order to help keep this under control.  She reports that as long she takes her medications that are controlled.  She denies having issues with fevers, chills, sweats.  She denies any joint pain or rashes.  A full 10 point ROS is listed below.  On further discussion she reports that she is a never smoker and only  rarely drinks alcohol.  She currently works as a Marine scientist at the [employer].  Her family history is remarkable for RA in her mother and type 2 diabetes and hypertension in both maternal grandparents.  She reports that her paternal grandmother has Parkinson's disease.  She has 2 healthy children.  She does occasionally have some sharp pain in her left chest but otherwise is asymptomatic.  MEDICAL HISTORY:  Past Medical History:  Diagnosis Date   Urticaria     SURGICAL HISTORY: Past Surgical History:  Procedure Laterality Date   CESAREAN SECTION     WISDOM TOOTH EXTRACTION      SOCIAL HISTORY: Social History   Socioeconomic History   Marital status: Married    Spouse name: Not on file   Number of children: Not on file   Years of education: Not on file   Highest education level: Not on file  Occupational History   Occupation: nurse  Tobacco Use   Smoking status: Never   Smokeless tobacco: Never  Vaping Use   Vaping Use: Never used  Substance and Sexual Activity   Alcohol use: Yes    Comment: occ   Drug use: No   Sexual activity: Yes    Birth control/protection: I.U.D.  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[employer]   Social Determinants of Health   Financial Resource Strain: Not on file  Food Insecurity: Not on file  Transportation Needs: Not on file  Physical Activity: Not on file  Stress: Not on file  Social Connections: Not on file  Intimate Partner Violence: Not on file    FAMILY HISTORY: Family History  Problem Relation Age of Onset   Rheum arthritis Mother    Eczema Mother    Healthy Father    Healthy Sister    Healthy Brother    Healthy Daughter    Diabetes Maternal Grandmother    Hypertension Maternal Grandfather    Parkinson's disease Paternal Grandmother     ALLERGIES:  is allergic to eggs or egg-derived products, shrimp [shellfish allergy], and wheat bran.  MEDICATIONS:  Current Outpatient Medications   Medication Sig Dispense Refill   AUVI-Q 0.3 MG/0.3ML SOAJ injection      cetirizine (ZYRTEC) 10 MG tablet Take 1 tablet (10 mg total) by mouth 2 (two) times daily. 60 tablet 5   famotidine (PEPCID) 20 MG tablet Take 1 tablet (20 mg total) by mouth 2 (two) times daily. 60 tablet 5   norgestimate-ethinyl estradiol (ORTHO-CYCLEN) 0.25-35 MG-MCG tablet TAKE 1 TABLET BY MOUTH DAILY. SKIP PLACEBO PILLS AND RESTART A NEW PACK EVERY 21 DAYS. 84 tablet 4   omalizumab (XOLAIR) 150 MG/ML prefilled syringe INJECT 2 PENS (300 MG) INTO THE SKIN EVERY 28 DAYS. 2 mL 11   Current Facility-Administered Medications  Medication Dose Route Frequency Provider Last Rate Last Admin   omalizumab Arvid Right) injection 300 mg  300 mg Subcutaneous Q28 days Kennith Gain, MD   300 mg at 06/03/21 1612    REVIEW OF SYSTEMS:   Constitutional: ( - ) fevers, ( - )  chills , ( - ) night sweats Eyes: ( - ) blurriness of vision, ( - ) double vision, ( - ) watery eyes Ears, nose, mouth, throat, and face: ( - ) mucositis, ( - ) sore throat Respiratory: ( - ) cough, ( - ) dyspnea, ( - ) wheezes Cardiovascular: ( - ) palpitation, ( - ) chest discomfort, ( - ) lower extremity swelling Gastrointestinal:  ( - ) nausea, ( - ) heartburn, ( - ) change in bowel habits Skin: ( - ) abnormal skin rashes Lymphatics: ( - ) new lymphadenopathy, ( - ) easy bruising Neurological: ( - ) numbness, ( - ) tingling, ( - ) new weaknesses Behavioral/Psych: ( - ) mood change, ( - ) new changes  All other systems were reviewed with the patient and are negative.  PHYSICAL EXAMINATION:  Vitals:   07/01/21 0900  BP: 123/73  Pulse: 74  Resp: 20  Temp: 98.9 F (37.2 C)  SpO2: 100%   Filed Weights   07/01/21 0900  Weight: 187 lb 6.4 oz (85 kg)    GENERAL: well appearing young African-American female in NAD  SKIN: skin color, texture, turgor are normal, no rashes or significant lesions EYES: conjunctiva are pink and non-injected,  sclera clear OROPHARYNX: no exudate, no erythema; lips, buccal mucosa, and tongue normal  NECK: supple, non-tender LYMPH:  no palpable lymphadenopathy in the cervical, axillary or supraclavicular lymph nodes.  LUNGS: clear to auscultation and percussion with normal breathing effort HEART: regular rate & rhythm and no murmurs and no lower extremity edema ABDOMEN: soft, non-tender, non-distended, normal bowel sounds Musculoskeletal: no cyanosis of digits and no clubbing  PSYCH: alert & oriented x 3, fluent speech NEURO: no focal motor/sensory deficits  LABORATORY DATA:  I have reviewed the data as listed CBC Latest Ref Rng & Units 06/24/2021 02/05/2020 08/08/2019  WBC 3.4 - 10.8 x10E3/uL 16.3(H) 12.7(H) 13.1(H)  Hemoglobin 11.1 -  15.9 g/dL 11.6 11.2 11.9  Hematocrit 34.0 - 46.6 % 36.5 34.7 36.3  Platelets 150 - 450 x10E3/uL 360 287 321    CMP Latest Ref Rng & Units 06/24/2021 07/11/2019 11/01/2015  Glucose 70 - 99 mg/dL 76 69 109(H)  BUN 6 - 20 mg/dL '7 8 7  ' Creatinine 0.57 - 1.00 mg/dL 0.64 0.62 0.66  Sodium 134 - 144 mmol/L 139 141 141  Potassium 3.5 - 5.2 mmol/L 4.2 4.1 3.6  Chloride 96 - 106 mmol/L 103 102 106  CO2 20 - 29 mmol/L '22 23 26  ' Calcium 8.7 - 10.2 mg/dL 9.4 9.6 9.6  Total Protein 6.0 - 8.5 g/dL 7.0 7.3 7.2  Total Bilirubin 0.0 - 1.2 mg/dL 0.4 0.5 0.8  Alkaline Phos 44 - 121 IU/L 50 68 52  AST 0 - 40 IU/L '13 15 15  ' ALT 0 - 32 IU/L 6 13 10(L)    RADIOGRAPHIC STUDIES: I have personally reviewed the radiological images as listed and agreed with the findings in the report. No results found.  ASSESSMENT & PLAN Crystal Simpson 30 y.o. female with medical history significant for urticaria who presents for evaluation of leukocytosis.   After review of the labs, review of the records, and discussion with the patient the patients findings are most consistent with leukocytosis and neutrophilia secondary to inflammation.  The patient has a condition which causes her to have  spontaneous hives.  It is possible that this underlying condition causes inflammation driving of her white blood cell count.  She is a never smoker and does not have any other known disorders which would cause leukocytosis.  Today we will check her for inflammatory markers as well as an MPN panel, though I have low suspicion for a primary hematological disorder driving her leukocytosis.  #Neutrophilia/Leukocytosis -- Findings are consistent with neutrophilia and leukocytosis secondary to chronic inflammation.  She previously had elevated ESR on 08/08/2019 --Today we will recheck CBC, CMP, LDH, and inflammatory markers --Low suspicion of MPN however we will order JAK2 with reflex panel as well as BCR/ABL FISH --Likely underlying inflammatory condition is connected to her recurrent hives. --No need for routine follow-up unless there are concerning findings noted in our above work-up.  Orders Placed This Encounter  Procedures   CBC with Differential ([place] Only)    Standing Status:   Future    Standing Expiration Date:   07/01/2022   CMP ([place] only)    Standing Status:   Future    Standing Expiration Date:   07/01/2022   Lactate dehydrogenase (LDH)    Standing Status:   Future    Standing Expiration Date:   07/01/2022   Sedimentation rate    Standing Status:   Future    Standing Expiration Date:   07/01/2022   C-reactive protein    Standing Status:   Future    Standing Expiration Date:   07/01/2022   BCR ABL1 FISH ([facility])    Standing Status:   Future    Standing Expiration Date:   07/01/2022   JAK2 (INCLUDING V617F AND EXON 12), MPL,& CALR W/RFL MPN PANEL ([facility])    Standing Status:   Future    Standing Expiration Date:   07/01/2022    All questions were answered. The patient knows to call the clinic with any problems, questions or concerns.  A total of more than 60 minutes were spent on this encounter with face-to-face time and non-face-to-face time, including preparing to  see the patient,  ordering tests and/or medications, counseling the patient and coordination of care as outlined above.   Ledell Peoples, MD Department of Hematology/Oncology [facility] Phone: [phone] Pager: [phone] Email: [email or web address]  07/01/2021 9:34 AM

## 2021-07-01 ENCOUNTER — Encounter: Payer: Self-pay | Admitting: Hematology and Oncology

## 2021-07-01 ENCOUNTER — Ambulatory Visit (INDEPENDENT_AMBULATORY_CARE_PROVIDER_SITE_OTHER): Payer: 59 | Admitting: *Deleted

## 2021-07-01 ENCOUNTER — Inpatient Hospital Stay: Payer: 59 | Attending: Hematology and Oncology | Admitting: Hematology and Oncology

## 2021-07-01 ENCOUNTER — Inpatient Hospital Stay: Payer: 59

## 2021-07-01 ENCOUNTER — Other Ambulatory Visit: Payer: Self-pay

## 2021-07-01 VITALS — BP 123/73 | HR 74 | Temp 98.9°F | Resp 20 | Wt 187.4 lb

## 2021-07-01 DIAGNOSIS — L509 Urticaria, unspecified: Secondary | ICD-10-CM

## 2021-07-01 DIAGNOSIS — L501 Idiopathic urticaria: Secondary | ICD-10-CM

## 2021-07-01 DIAGNOSIS — Z833 Family history of diabetes mellitus: Secondary | ICD-10-CM

## 2021-07-01 DIAGNOSIS — Z840 Family history of diseases of the skin and subcutaneous tissue: Secondary | ICD-10-CM | POA: Diagnosis not present

## 2021-07-01 DIAGNOSIS — Z8261 Family history of arthritis: Secondary | ICD-10-CM | POA: Diagnosis not present

## 2021-07-01 DIAGNOSIS — D72829 Elevated white blood cell count, unspecified: Secondary | ICD-10-CM | POA: Diagnosis not present

## 2021-07-01 DIAGNOSIS — Z8249 Family history of ischemic heart disease and other diseases of the circulatory system: Secondary | ICD-10-CM

## 2021-07-01 DIAGNOSIS — R0789 Other chest pain: Secondary | ICD-10-CM | POA: Diagnosis not present

## 2021-07-01 DIAGNOSIS — Z818 Family history of other mental and behavioral disorders: Secondary | ICD-10-CM | POA: Diagnosis not present

## 2021-07-01 DIAGNOSIS — D72825 Bandemia: Secondary | ICD-10-CM | POA: Diagnosis not present

## 2021-07-01 DIAGNOSIS — J45909 Unspecified asthma, uncomplicated: Secondary | ICD-10-CM | POA: Diagnosis not present

## 2021-07-01 DIAGNOSIS — L508 Other urticaria: Secondary | ICD-10-CM

## 2021-07-01 LAB — CBC WITH DIFFERENTIAL (CANCER CENTER ONLY)
Abs Immature Granulocytes: 0.04 10*3/uL (ref 0.00–0.07)
Basophils Absolute: 0 10*3/uL (ref 0.0–0.1)
Basophils Relative: 0 %
Eosinophils Absolute: 0.2 10*3/uL (ref 0.0–0.5)
Eosinophils Relative: 1 %
HCT: 36.8 % (ref 36.0–46.0)
Hemoglobin: 11.8 g/dL — ABNORMAL LOW (ref 12.0–15.0)
Immature Granulocytes: 0 %
Lymphocytes Relative: 17 %
Lymphs Abs: 2 10*3/uL (ref 0.7–4.0)
MCH: 28.2 pg (ref 26.0–34.0)
MCHC: 32.1 g/dL (ref 30.0–36.0)
MCV: 87.8 fL (ref 80.0–100.0)
Monocytes Absolute: 0.9 10*3/uL (ref 0.1–1.0)
Monocytes Relative: 7 %
Neutro Abs: 9.1 10*3/uL — ABNORMAL HIGH (ref 1.7–7.7)
Neutrophils Relative %: 75 %
Platelet Count: 325 10*3/uL (ref 150–400)
RBC: 4.19 MIL/uL (ref 3.87–5.11)
RDW: 13.4 % (ref 11.5–15.5)
WBC Count: 12.2 10*3/uL — ABNORMAL HIGH (ref 4.0–10.5)
nRBC: 0 % (ref 0.0–0.2)

## 2021-07-01 LAB — CMP (CANCER CENTER ONLY)
ALT: 6 U/L (ref 0–44)
AST: 14 U/L — ABNORMAL LOW (ref 15–41)
Albumin: 3.6 g/dL (ref 3.5–5.0)
Alkaline Phosphatase: 45 U/L (ref 38–126)
Anion gap: 9 (ref 5–15)
BUN: 9 mg/dL (ref 6–20)
CO2: 24 mmol/L (ref 22–32)
Calcium: 9.6 mg/dL (ref 8.9–10.3)
Chloride: 108 mmol/L (ref 98–111)
Creatinine: 0.73 mg/dL (ref 0.44–1.00)
GFR, Estimated: >60 mL/min (ref >60)
Glucose, Bld: 102 mg/dL — ABNORMAL HIGH (ref 70–99)
Potassium: 4.2 mmol/L (ref 3.5–5.1)
Sodium: 141 mmol/L (ref 135–145)
Total Bilirubin: 0.4 mg/dL (ref 0.3–1.2)
Total Protein: 7.5 g/dL (ref 6.5–8.1)

## 2021-07-01 LAB — LACTATE DEHYDROGENASE: LDH: 140 U/L (ref 98–192)

## 2021-07-01 LAB — C-REACTIVE PROTEIN: CRP: 0.8 mg/dL (ref <1.0)

## 2021-07-01 LAB — SEDIMENTATION RATE: Sed Rate: 22 mm/hr (ref 0–22)

## 2021-07-08 LAB — BCR ABL1 FISH (GENPATH)

## 2021-07-08 LAB — JAK2 (INCLUDING V617F AND EXON 12), MPL,& CALR W/RFL MPN PANEL (NGS)

## 2021-07-14 ENCOUNTER — Other Ambulatory Visit (HOSPITAL_COMMUNITY): Payer: Self-pay

## 2021-07-14 DIAGNOSIS — N6310 Unspecified lump in the right breast, unspecified quadrant: Secondary | ICD-10-CM | POA: Diagnosis not present

## 2021-07-22 ENCOUNTER — Telehealth: Payer: Self-pay | Admitting: *Deleted

## 2021-07-22 ENCOUNTER — Telehealth: Payer: Self-pay | Admitting: Hematology and Oncology

## 2021-07-22 NOTE — Telephone Encounter (Addendum)
Contacted patient regarding test results per Dr. 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 It does not have any particular treatment and it often doesn't cause any harm. We will continue to monitor your blood counts to assure it does not cause problems. There is no specific treatment for this mutation. We will plan to see her back in 6 months time to assure her WBC are stable.  Patient verbalized understanding of information.Encouraged patient to contact office for further questions or concerns.

## 2021-07-22 NOTE — Telephone Encounter (Signed)
Scheduled per sch msg. Called and spoke with patient. Confirmed appt  

## 2021-07-24 ENCOUNTER — Other Ambulatory Visit (HOSPITAL_COMMUNITY): Payer: Self-pay

## 2021-07-27 ENCOUNTER — Other Ambulatory Visit (HOSPITAL_COMMUNITY): Payer: Self-pay

## 2021-07-27 MED FILL — Omalizumab Subcutaneous Soln Prefilled Syringe 150 MG/ML: SUBCUTANEOUS | 28 days supply | Qty: 2 | Fill #7 | Status: AC

## 2021-07-29 ENCOUNTER — Ambulatory Visit: Payer: 59

## 2021-07-31 ENCOUNTER — Encounter: Payer: Self-pay | Admitting: Hematology and Oncology

## 2021-08-04 ENCOUNTER — Other Ambulatory Visit: Payer: Self-pay

## 2021-08-04 ENCOUNTER — Ambulatory Visit (INDEPENDENT_AMBULATORY_CARE_PROVIDER_SITE_OTHER): Payer: 59 | Admitting: *Deleted

## 2021-08-04 DIAGNOSIS — L501 Idiopathic urticaria: Secondary | ICD-10-CM | POA: Diagnosis not present

## 2021-08-04 DIAGNOSIS — L508 Other urticaria: Secondary | ICD-10-CM

## 2021-08-10 DIAGNOSIS — N6315 Unspecified lump in the right breast, overlapping quadrants: Secondary | ICD-10-CM | POA: Diagnosis not present

## 2021-08-10 DIAGNOSIS — R922 Inconclusive mammogram: Secondary | ICD-10-CM | POA: Diagnosis not present

## 2021-08-10 DIAGNOSIS — N644 Mastodynia: Secondary | ICD-10-CM | POA: Diagnosis not present

## 2021-08-14 ENCOUNTER — Other Ambulatory Visit (HOSPITAL_COMMUNITY): Payer: Self-pay

## 2021-08-17 ENCOUNTER — Other Ambulatory Visit (HOSPITAL_COMMUNITY): Payer: Self-pay

## 2021-08-18 ENCOUNTER — Other Ambulatory Visit (HOSPITAL_COMMUNITY): Payer: Self-pay

## 2021-08-19 ENCOUNTER — Other Ambulatory Visit (HOSPITAL_COMMUNITY): Payer: Self-pay

## 2021-08-27 ENCOUNTER — Other Ambulatory Visit (HOSPITAL_COMMUNITY): Payer: Self-pay

## 2021-08-27 DIAGNOSIS — Z130 Encounter for screening for diseases of the blood and blood-forming organs and certain disorders involving the immune mechanism: Secondary | ICD-10-CM | POA: Diagnosis not present

## 2021-08-27 DIAGNOSIS — Z1389 Encounter for screening for other disorder: Secondary | ICD-10-CM | POA: Diagnosis not present

## 2021-08-27 DIAGNOSIS — Z3041 Encounter for surveillance of contraceptive pills: Secondary | ICD-10-CM | POA: Diagnosis not present

## 2021-08-27 DIAGNOSIS — Z113 Encounter for screening for infections with a predominantly sexual mode of transmission: Secondary | ICD-10-CM | POA: Diagnosis not present

## 2021-08-27 DIAGNOSIS — Z01419 Encounter for gynecological examination (general) (routine) without abnormal findings: Secondary | ICD-10-CM | POA: Diagnosis not present

## 2021-08-27 DIAGNOSIS — Z6832 Body mass index (BMI) 32.0-32.9, adult: Secondary | ICD-10-CM | POA: Diagnosis not present

## 2021-08-27 MED ORDER — NORGESTIMATE-ETH ESTRADIOL 0.25-35 MG-MCG PO TABS
1.0000 | ORAL_TABLET | Freq: Every day | ORAL | 4 refills | Status: DC
  Filled 2021-08-27: qty 112, 84d supply, fill #0
  Filled 2021-10-14: qty 84, 63d supply, fill #0
  Filled 2021-12-17: qty 84, 63d supply, fill #1
  Filled 2022-03-03: qty 84, 63d supply, fill #2
  Filled 2022-04-20: qty 84, 63d supply, fill #3
  Filled 2022-06-24: qty 84, 63d supply, fill #4

## 2021-08-27 MED FILL — Omalizumab Subcutaneous Soln Prefilled Syringe 150 MG/ML: SUBCUTANEOUS | 28 days supply | Qty: 2 | Fill #8 | Status: AC

## 2021-09-01 ENCOUNTER — Ambulatory Visit (INDEPENDENT_AMBULATORY_CARE_PROVIDER_SITE_OTHER): Payer: 59

## 2021-09-01 ENCOUNTER — Other Ambulatory Visit: Payer: Self-pay

## 2021-09-01 DIAGNOSIS — L501 Idiopathic urticaria: Secondary | ICD-10-CM

## 2021-09-04 ENCOUNTER — Other Ambulatory Visit (HOSPITAL_COMMUNITY): Payer: Self-pay

## 2021-09-16 ENCOUNTER — Other Ambulatory Visit (HOSPITAL_COMMUNITY): Payer: Self-pay

## 2021-09-18 ENCOUNTER — Other Ambulatory Visit (HOSPITAL_COMMUNITY): Payer: Self-pay

## 2021-09-18 MED FILL — Omalizumab Subcutaneous Soln Prefilled Syringe 150 MG/ML: SUBCUTANEOUS | 28 days supply | Qty: 2 | Fill #9 | Status: AC

## 2021-09-24 ENCOUNTER — Other Ambulatory Visit (HOSPITAL_COMMUNITY): Payer: Self-pay

## 2021-09-25 ENCOUNTER — Other Ambulatory Visit (HOSPITAL_COMMUNITY): Payer: Self-pay

## 2021-09-29 ENCOUNTER — Other Ambulatory Visit: Payer: Self-pay

## 2021-09-29 ENCOUNTER — Ambulatory Visit (INDEPENDENT_AMBULATORY_CARE_PROVIDER_SITE_OTHER): Payer: 59 | Admitting: *Deleted

## 2021-09-29 DIAGNOSIS — L501 Idiopathic urticaria: Secondary | ICD-10-CM

## 2021-10-14 ENCOUNTER — Other Ambulatory Visit (HOSPITAL_COMMUNITY): Payer: Self-pay

## 2021-10-22 ENCOUNTER — Other Ambulatory Visit (HOSPITAL_COMMUNITY): Payer: Self-pay

## 2021-10-22 MED FILL — Omalizumab Subcutaneous Soln Prefilled Syringe 150 MG/ML: SUBCUTANEOUS | 28 days supply | Qty: 2 | Fill #10 | Status: AC

## 2021-10-27 ENCOUNTER — Ambulatory Visit: Payer: 59

## 2021-11-09 ENCOUNTER — Ambulatory Visit (INDEPENDENT_AMBULATORY_CARE_PROVIDER_SITE_OTHER): Payer: Self-pay

## 2021-11-09 ENCOUNTER — Other Ambulatory Visit: Payer: Self-pay

## 2021-11-09 DIAGNOSIS — L501 Idiopathic urticaria: Secondary | ICD-10-CM

## 2021-11-13 ENCOUNTER — Other Ambulatory Visit (HOSPITAL_COMMUNITY): Payer: Self-pay

## 2021-11-21 ENCOUNTER — Telehealth: Payer: Self-pay | Admitting: Nurse Practitioner

## 2021-11-21 DIAGNOSIS — N3000 Acute cystitis without hematuria: Secondary | ICD-10-CM

## 2021-11-21 MED ORDER — NITROFURANTOIN MONOHYD MACRO 100 MG PO CAPS
100.0000 mg | ORAL_CAPSULE | Freq: Two times a day (BID) | ORAL | 0 refills | Status: AC
  Filled 2021-11-21: qty 10, 5d supply, fill #0

## 2021-11-21 NOTE — Progress Notes (Signed)
E-Visit for Urinary Problems ° °We are sorry that you are not feeling well.  Here is how we plan to help! ° °Based on what you shared with me it looks like you most likely have a simple urinary tract infection. ° °A UTI (Urinary Tract Infection) is a bacterial infection of the bladder. ° °Most cases of urinary tract infections are simple to treat but a key part of your care is to encourage you to drink plenty of fluids and watch your symptoms carefully. ° °I have prescribed MacroBid 100 mg twice a day for 5 days.  Your symptoms should gradually improve. Call us if the burning in your urine worsens, you develop worsening fever, back pain or pelvic pain or if your symptoms do not resolve after completing the antibiotic. ° °Urinary tract infections can be prevented by drinking plenty of water to keep your body hydrated.  Also be sure when you wipe, wipe from front to back and don't hold it in!  If possible, empty your bladder every 4 hours. ° °HOME CARE °Drink plenty of fluids °Compete the full course of the antibiotics even if the symptoms resolve °Remember, when you need to go…go. Holding in your urine can increase the likelihood of getting a UTI! °GET HELP RIGHT AWAY IF: °You cannot urinate °You get a high fever °Worsening back pain occurs °You see blood in your urine °You feel sick to your stomach or throw up °You feel like you are going to pass out ° °MAKE SURE YOU  °Understand these instructions. °Will watch your condition. °Will get help right away if you are not doing well or get worse. ° ° °Thank you for choosing an e-visit. ° °Your e-visit answers were reviewed by a board certified advanced clinical practitioner to complete your personal care plan. Depending upon the condition, your plan could have included both over the counter or prescription medications. ° °Please review your pharmacy choice. Make sure the pharmacy is open so you can pick up prescription now. If there is a problem, you may contact your  provider through MyChart messaging and have the prescription routed to another pharmacy.  Your safety is important to us. If you have drug allergies check your prescription carefully.  ° °For the next 24 hours you can use MyChart to ask questions about today's visit, request a non-urgent call back, or ask for a work or school excuse. °You will get an email in the next two days asking about your experience. I hope that your e-visit has been valuable and will speed your recovery.  ° °I spent approximately 7 minutes reviewing the patient's history, current symptoms and coordinating their plan of care today.   ° °Meds ordered this encounter  °Medications  ° nitrofurantoin, macrocrystal-monohydrate, (MACROBID) 100 MG capsule  °  Sig: Take 1 capsule (100 mg total) by mouth 2 (two) times daily for 5 days.  °  Dispense:  10 capsule  °  Refill:  0  °  °

## 2021-11-23 ENCOUNTER — Other Ambulatory Visit (HOSPITAL_COMMUNITY): Payer: Self-pay

## 2021-11-25 ENCOUNTER — Other Ambulatory Visit: Payer: Self-pay

## 2021-11-25 ENCOUNTER — Ambulatory Visit: Payer: Self-pay | Attending: Family | Admitting: Pharmacist

## 2021-11-25 DIAGNOSIS — Z79899 Other long term (current) drug therapy: Secondary | ICD-10-CM

## 2021-11-25 NOTE — Progress Notes (Signed)
? ?  S: ?Patient presents for review of their specialty medication therapy. ? ?Patient is currently taking Xolair for chronic urticaria. Patient is managed by Dr. Nelva Bush for this.  ? ?Adherence: confirmed. Takes every month. ? ?Efficacy: Reports that she has to use other medication occasionally but she feels that the Xolair is working to control things better. ? ?Dosing: Give subcutaneously.   ?Chronic idiopathic urticaria: SubQ: 150 or 300 mg every 4 weeks. Dosing is not dependent on serum IgE (free or total) level or body weight. ? ?Dose adjustments: ?Renal: no dose adjustments  ?Hepatic: no dose adjustments  ?Toxicity: Severe hypersensitivity reaction or anaphylaxis: Discontinue treatment. ?Fever, arthralgia, and rash: Discontinue treatment if this constellation of symptoms occurs. ? ?Drug-drug interactions: none identified  ? ?Monitoring: ?CV effects: none ?Eosinophilia and vasculitis: none ?Fever/arthralgia/rash: none ?Hypersensitivity/Anaphylaxis: none ?Malignant neoplasms: none ? ?O: ?   ? ?Lab Results  ?Component Value Date  ? WBC 12.2 (H) 07/01/2021  ? HGB 11.8 (L) 07/01/2021  ? HCT 36.8 07/01/2021  ? MCV 87.8 07/01/2021  ? PLT 325 07/01/2021  ? ? ?  Chemistry   ?   ?Component Value Date/Time  ? NA 141 07/01/2021 0937  ? NA 139 06/24/2021 1542  ? K 4.2 07/01/2021 0937  ? CL 108 07/01/2021 0937  ? CO2 24 07/01/2021 0937  ? BUN 9 07/01/2021 0937  ? BUN 7 06/24/2021 1542  ? CREATININE 0.73 07/01/2021 0937  ?    ?Component Value Date/Time  ? CALCIUM 9.6 07/01/2021 0937  ? ALKPHOS 45 07/01/2021 0937  ? AST 14 (L) 07/01/2021 [id number]  ? ALT 6 07/01/2021 0937  ? BILITOT 0.4 07/01/2021 [id number]  ?  ? ? ? ?A/P: ?1. Medication review: patient currently on Xolair for chronic urticaria. Reviewed the medication with the patient, including the following: Xolair, omalizumab, is a novel IgE blocker.  It appears to reduce rates of hospitalizations, ER visits and unscheduled physician visits due asthma exacerbations when added to  standard therapy.  Studies also show a reduction in steroid requirements and improvement in quality of life.  Patient educated on purpose, proper use and potential adverse effects of Xolair.  Following instruction patient verbalized understanding. Patient should always have an EpiPen readily available in the event of anaphylaxis. SubQ: For SubQ injection only; doses >150 mg should be divided over more than one injection site (eg, 225 mg or 300 mg administered as two injections, 375 mg administered as three injections); each injection site should be separated by ?1 inch. Do not inject into moles, scars, bruises, tender areas, or broken skin. Injections may take 5 to 10 seconds to administer (solution is slightly viscous). Administer only under direct medical supervision and observe patient for 2 hours after the first 3 injections and 30 minutes after subsequent injections Dellia Cloud 2015) or in accordance with individual institution policies and procedures.No recommendations for any changes at this time.  ? ? ?Benard Halsted, PharmD, BCACP, CPP ?Clinical Pharmacist ?[facility] ?[phone] ? ? ? ? ? ? ? ? ?

## 2021-11-27 ENCOUNTER — Other Ambulatory Visit (HOSPITAL_COMMUNITY): Payer: Self-pay

## 2021-11-30 ENCOUNTER — Other Ambulatory Visit (HOSPITAL_COMMUNITY): Payer: Self-pay

## 2021-11-30 ENCOUNTER — Other Ambulatory Visit: Payer: Self-pay | Admitting: Allergy

## 2021-12-02 ENCOUNTER — Other Ambulatory Visit: Payer: Self-pay | Admitting: Allergy

## 2021-12-02 ENCOUNTER — Other Ambulatory Visit (HOSPITAL_COMMUNITY): Payer: Self-pay

## 2021-12-03 ENCOUNTER — Other Ambulatory Visit: Payer: Self-pay | Admitting: Pharmacist

## 2021-12-03 ENCOUNTER — Other Ambulatory Visit (HOSPITAL_COMMUNITY): Payer: Self-pay

## 2021-12-03 MED ORDER — OMALIZUMAB 150 MG/ML ~~LOC~~ SOSY
300.0000 mg | PREFILLED_SYRINGE | SUBCUTANEOUS | 11 refills | Status: DC
  Filled 2021-12-03: qty 2, 28d supply, fill #0

## 2021-12-03 MED ORDER — OMALIZUMAB 150 MG/ML ~~LOC~~ SOSY
300.0000 mg | PREFILLED_SYRINGE | SUBCUTANEOUS | 11 refills | Status: DC
  Filled 2021-12-03: qty 2, 28d supply, fill #0
  Filled 2021-12-25: qty 2, 28d supply, fill #1
  Filled 2022-01-20: qty 2, 28d supply, fill #2
  Filled 2022-03-12: qty 2, 28d supply, fill #3
  Filled 2022-06-24 – 2022-06-28 (×2): qty 2, 28d supply, fill #4
  Filled 2022-07-23: qty 2, 28d supply, fill #5
  Filled 2022-08-17: qty 2, 28d supply, fill #6
  Filled 2022-09-16: qty 2, 28d supply, fill #7
  Filled 2022-10-20 – 2022-11-03 (×7): qty 2, 28d supply, fill #8
  Filled 2022-11-23: qty 2, 28d supply, fill #9

## 2021-12-04 ENCOUNTER — Other Ambulatory Visit (HOSPITAL_COMMUNITY): Payer: Self-pay

## 2021-12-07 ENCOUNTER — Other Ambulatory Visit: Payer: Self-pay

## 2021-12-07 ENCOUNTER — Ambulatory Visit (INDEPENDENT_AMBULATORY_CARE_PROVIDER_SITE_OTHER): Payer: Self-pay

## 2021-12-07 DIAGNOSIS — L501 Idiopathic urticaria: Secondary | ICD-10-CM

## 2021-12-17 ENCOUNTER — Other Ambulatory Visit (HOSPITAL_COMMUNITY): Payer: Self-pay

## 2021-12-25 ENCOUNTER — Other Ambulatory Visit (HOSPITAL_COMMUNITY): Payer: Self-pay

## 2021-12-25 NOTE — Progress Notes (Signed)
Erroneous encounter

## 2021-12-28 ENCOUNTER — Encounter: Payer: Self-pay | Admitting: Family

## 2021-12-28 DIAGNOSIS — R399 Unspecified symptoms and signs involving the genitourinary system: Secondary | ICD-10-CM

## 2021-12-30 ENCOUNTER — Other Ambulatory Visit (HOSPITAL_COMMUNITY): Payer: Self-pay

## 2022-01-11 ENCOUNTER — Ambulatory Visit: Payer: No Typology Code available for payment source

## 2022-01-11 ENCOUNTER — Ambulatory Visit (INDEPENDENT_AMBULATORY_CARE_PROVIDER_SITE_OTHER): Payer: No Typology Code available for payment source

## 2022-01-11 DIAGNOSIS — L501 Idiopathic urticaria: Secondary | ICD-10-CM

## 2022-01-18 ENCOUNTER — Other Ambulatory Visit (HOSPITAL_COMMUNITY): Payer: Self-pay

## 2022-01-20 ENCOUNTER — Other Ambulatory Visit: Payer: Self-pay | Admitting: Hematology and Oncology

## 2022-01-20 ENCOUNTER — Inpatient Hospital Stay: Payer: Self-pay

## 2022-01-20 ENCOUNTER — Inpatient Hospital Stay: Payer: Self-pay | Admitting: Hematology and Oncology

## 2022-01-20 ENCOUNTER — Telehealth: Payer: Self-pay | Admitting: Hematology and Oncology

## 2022-01-20 ENCOUNTER — Other Ambulatory Visit (HOSPITAL_COMMUNITY): Payer: Self-pay

## 2022-01-20 DIAGNOSIS — D72825 Bandemia: Secondary | ICD-10-CM

## 2022-01-20 NOTE — Telephone Encounter (Signed)
R/s due to provider on call sched, message has been left with pt ?

## 2022-01-22 ENCOUNTER — Other Ambulatory Visit (HOSPITAL_COMMUNITY): Payer: Self-pay

## 2022-01-22 ENCOUNTER — Inpatient Hospital Stay: Payer: No Typology Code available for payment source | Attending: Hematology and Oncology

## 2022-01-22 ENCOUNTER — Other Ambulatory Visit: Payer: Self-pay

## 2022-01-22 ENCOUNTER — Encounter: Payer: Self-pay | Admitting: Hematology and Oncology

## 2022-01-22 ENCOUNTER — Inpatient Hospital Stay (HOSPITAL_BASED_OUTPATIENT_CLINIC_OR_DEPARTMENT_OTHER): Payer: No Typology Code available for payment source | Admitting: Hematology and Oncology

## 2022-01-22 VITALS — BP 124/81 | HR 74 | Temp 98.2°F | Resp 16 | Wt 186.8 lb

## 2022-01-22 DIAGNOSIS — D72825 Bandemia: Secondary | ICD-10-CM

## 2022-01-22 LAB — CBC WITH DIFFERENTIAL (CANCER CENTER ONLY)
Abs Immature Granulocytes: 0.05 10*3/uL (ref 0.00–0.07)
Basophils Absolute: 0.1 10*3/uL (ref 0.0–0.1)
Basophils Relative: 0 %
Eosinophils Absolute: 0.1 10*3/uL (ref 0.0–0.5)
Eosinophils Relative: 1 %
HCT: 35.9 % — ABNORMAL LOW (ref 36.0–46.0)
Hemoglobin: 11.4 g/dL — ABNORMAL LOW (ref 12.0–15.0)
Immature Granulocytes: 0 %
Lymphocytes Relative: 20 %
Lymphs Abs: 3 10*3/uL (ref 0.7–4.0)
MCH: 28.4 pg (ref 26.0–34.0)
MCHC: 31.8 g/dL (ref 30.0–36.0)
MCV: 89.3 fL (ref 80.0–100.0)
Monocytes Absolute: 1.2 10*3/uL — ABNORMAL HIGH (ref 0.1–1.0)
Monocytes Relative: 8 %
Neutro Abs: 10.3 10*3/uL — ABNORMAL HIGH (ref 1.7–7.7)
Neutrophils Relative %: 71 %
Platelet Count: 327 10*3/uL (ref 150–400)
RBC: 4.02 MIL/uL (ref 3.87–5.11)
RDW: 13.9 % (ref 11.5–15.5)
WBC Count: 14.7 10*3/uL — ABNORMAL HIGH (ref 4.0–10.5)
nRBC: 0 % (ref 0.0–0.2)

## 2022-01-22 LAB — CMP (CANCER CENTER ONLY)
ALT: 6 U/L (ref 0–44)
AST: 10 U/L — ABNORMAL LOW (ref 15–41)
Albumin: 3.9 g/dL (ref 3.5–5.0)
Alkaline Phosphatase: 42 U/L (ref 38–126)
Anion gap: 7 (ref 5–15)
BUN: 11 mg/dL (ref 6–20)
CO2: 25 mmol/L (ref 22–32)
Calcium: 9 mg/dL (ref 8.9–10.3)
Chloride: 105 mmol/L (ref 98–111)
Creatinine: 0.63 mg/dL (ref 0.44–1.00)
GFR, Estimated: >60 mL/min (ref >60)
Glucose, Bld: 90 mg/dL (ref 70–99)
Potassium: 3.9 mmol/L (ref 3.5–5.1)
Sodium: 137 mmol/L (ref 135–145)
Total Bilirubin: 0.5 mg/dL (ref 0.3–1.2)
Total Protein: 7.3 g/dL (ref 6.5–8.1)

## 2022-01-22 LAB — LACTATE DEHYDROGENASE: LDH: 113 U/L (ref 98–192)

## 2022-01-22 NOTE — Progress Notes (Signed)
?[facility] ?[phone]   [phone] ? ?PROGRESS NOTE ? ?Patient Care Team: ?Camillia Herter, NP as PCP - General (Nurse Practitioner) ? ?Hematological/Oncological History ?# Leukocytosis ?11/21/2015: WBC 14.5, Hgb 12.7, MCV 90, Plt 289 ?07/11/2019: WBC 14.6, Hgb 12.5, MCV 87, Plt 306 ?08/08/2019: WBC 13.1, Hgb 11.9, MCV 89, Plt 321. [place] 8000. ESR 36 ?02/05/2020: WBC 12.7, Hgb 11.2, Plt 287 ?06/24/2021: WBC 16.3, Hgb 11.6, Plt 360, MCV 88 ?07/01/2021: establish care with Dr. Lorenso Courier  ? ?Interval History:  ?Crystal Simpson 31 y.o. female with medical history significant for leukocytosis who presents for a follow up visit. The patient's last visit was on 07/01/2021. 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  ? ?On exam today Mrs. Masterson notes that she did have a urinary tract infection last month.  She did have an increase in the frequency of urine was prescribed Macrobid.  She has finished off the prescription.  She is notes she has not had any further issues with her urinary tract since finishing the antibiotic.  She notes that she has not had any other infections other than that urinary tract infection.  She currently denies any fevers, chills, sweats, nausea, vomiting or diarrhea.  She reports a lot of birth control she currently has a menstrual cycle approximately every 3 months.  She notes that her energy has been so-so and that she ranks it as about a 7 out of 10.  She currently denies any nausea, vomiting, or diarrhea.  A full 10 point ROS is listed below. ? ?MEDICAL HISTORY:  ?Past Medical History:  ?Diagnosis Date  ? Urticaria   ? ? ?SURGICAL HISTORY: ?Past Surgical History:  ?Procedure Laterality Date  ? CESAREAN SECTION    ? WISDOM TOOTH EXTRACTION    ? ? ?SOCIAL HISTORY: ?Social History  ? ?Socioeconomic History  ? Marital status: Married  ?  Spouse name: Not on file  ? Number of  children: Not on file  ? Years of education: Not on file  ? Highest education level: Not on file  ?Occupational History  ? Occupation: nurse  ?Tobacco Use  ? Smoking status: Never  ? Smokeless tobacco: Never  ?Vaping Use  ? Vaping Use: Never used  ?Substance and Sexual Activity  ? Alcohol use: Yes  ?  Comment: occ  ? Drug use: No  ? Sexual activity: Yes  ?  Birth control/protection: I.U.D.  ?Other Topics Concern  ? Not on file  ?Social History Narrative  ? Nurse for [employer]  ? ?Social Determinants of Health  ? ?Financial Resource Strain: Not on file  ?Food Insecurity: Not on file  ?Transportation Needs: Not on file  ?Physical Activity: Not on file  ?Stress: Not on file  ?Social Connections: Not on file  ?Intimate Partner Violence: Not on file  ? ? ?FAMILY HISTORY: ?Family History  ?Problem Relation Age of Onset  ? Rheum arthritis Mother   ? Eczema Mother   ? Healthy Father   ? Healthy Sister   ? Healthy Brother   ? Healthy Daughter   ? Diabetes Maternal Grandmother   ? Hypertension Maternal Grandfather   ? Parkinson's disease Paternal Grandmother   ? ? ?ALLERGIES:  is allergic to eggs or egg-derived products, shrimp [shellfish allergy], and wheat bran. ? ?MEDICATIONS:  ?Current Outpatient Medications  ?Medication Sig Dispense Refill  ? AUVI-Q 0.3 MG/0.3ML SOAJ injection     ?  cetirizine (ZYRTEC) 10 MG tablet Take 1 tablet (10 mg total) by mouth 2 (two) times daily. 60 tablet 5  ? famotidine (PEPCID) 20 MG tablet Take 1 tablet (20 mg total) by mouth 2 (two) times daily. 60 tablet 5  ? norgestimate-ethinyl estradiol (FEMYNOR) 0.25-35 MG-MCG tablet Take 1 tablet by mouth daily. SKIP PLACEBO PILLS AND RESTART A NEW PACK EVERY 21 DAYS. 84 tablet 4  ? omalizumab (XOLAIR) 150 MG/ML prefilled syringe INJECT 2 PENS (300 MG) INTO THE SKIN EVERY 28 DAYS. 2 mL 11  ? ?Current Facility-Administered Medications  ?Medication Dose Route Frequency Provider Last Rate Last Admin  ? omalizumab Arvid Right) injection 300 mg  300 mg Subcutaneous  Q28 days Kennith Gain, MD   300 mg at 01/11/22 1059  ? ? ?REVIEW OF SYSTEMS:   ?Constitutional: ( - ) fevers, ( - )  chills , ( - ) night sweats ?Eyes: ( - ) blurriness of vision, ( - ) double vision, ( - ) watery eyes ?Ears, nose, mouth, throat, and face: ( - ) mucositis, ( - ) sore throat ?Respiratory: ( - ) cough, ( - ) dyspnea, ( - ) wheezes ?Cardiovascular: ( - ) palpitation, ( - ) chest discomfort, ( - ) lower extremity swelling ?Gastrointestinal:  ( - ) nausea, ( - ) heartburn, ( - ) change in bowel habits ?Skin: ( - ) abnormal skin rashes ?Lymphatics: ( - ) new lymphadenopathy, ( - ) easy bruising ?Neurological: ( - ) numbness, ( - ) tingling, ( - ) new weaknesses ?Behavioral/Psych: ( - ) mood change, ( - ) new changes  ?All other systems were reviewed with the patient and are negative. ? ?PHYSICAL EXAMINATION: ? ?Vitals:  ? 01/22/22 1101  ?BP: 124/81  ?Pulse: 74  ?Resp: 16  ?Temp: 98.2 ?F (36.8 ?C)  ?SpO2: 98%  ? ?Filed Weights  ? 01/22/22 1101  ?Weight: 186 lb 12.8 oz (84.7 kg)  ? ? ?GENERAL: Well-appearing young African-American female.  Alert, no distress and comfortable ?SKIN: skin color, texture, turgor are normal, no rashes or significant lesions ?EYES: conjunctiva are pink and non-injected, sclera clear ?LUNGS: clear to auscultation and percussion with normal breathing effort ?HEART: regular rate & rhythm and no murmurs and no lower extremity edema ?Musculoskeletal: no cyanosis of digits and no clubbing  ?PSYCH: alert & oriented x 3, fluent speech ?NEURO: no focal motor/sensory deficits ? ?LABORATORY DATA:  ?I have reviewed the data as listed ? ?  Latest Ref Rng & Units 01/22/2022  ? 10:50 AM 07/01/2021  ?  9:37 AM 06/24/2021  ?  3:42 PM  ?CBC  ?WBC 4.0 - 10.5 K/uL 14.7   12.2   16.3    ?Hemoglobin 12.0 - 15.0 g/dL 11.4   11.8   11.6    ?Hematocrit 36.0 - 46.0 % 35.9   36.8   36.5    ?Platelets 150 - 400 K/uL 327   325   360    ? ? ? ?  Latest Ref Rng & Units 01/22/2022  ? 10:50 AM 07/01/2021   ?  9:37 AM 06/24/2021  ?  3:42 PM  ?CMP  ?Glucose 70 - 99 mg/dL 90   102   76    ?BUN 6 - 20 mg/dL _0 ?Creatinine 0.44 - 1.00 mg/dL 0.63   0.73   0.64    ?Sodium 135 - 145 mmol/L 137   141   139    ?  Potassium 3.5 - 5.1 mmol/L 3.9   4.2   4.2    ?Chloride 98 - 111 mmol/L 105   108   103    ?CO2 22 - 32 mmol/L _0 ?Calcium 8.9 - 10.3 mg/dL 9.0   9.6   9.4    ?Total Protein 6.5 - 8.1 g/dL 7.3   7.5   7.0    ?Total Bilirubin 0.3 - 1.2 mg/dL 0.5   0.4   0.4    ?Alkaline Phos 38 - 126 U/L 42   45   50    ?AST 15 - 41 U/L _1 ?ALT 0 - 44 U/L _2 ? ?RADIOGRAPHIC STUDIES: ?No results found. ? ?ASSESSMENT & PLAN ?Crystal Simpson 31 y.o. female with medical history significant for leukocytosis who presents for a follow up visit.  ? ?Today we discussed the results of the NGS testing which showed ASXL1 mutation.  This is a tier 3 mutation also known as a variant of unknown clinical significance.  The long-term outcome from imitation of this nature is unclear.  There is no need for intervention at this time.  Overall would recommend continued monitoring of her blood counts.  She does have mild anemia today and at her next visit we will check iron levels to assure that she is not iron deficient. ? ?#Neutrophilia/Leukocytosis ?-- Findings are consistent with neutrophilia and leukocytosis secondary to ASXL1 mutation, noted on NGS testing.  ?--Today we will recheck CBC, CMP, LDH ?--RTC in 6 months for labs and 12 months for a clinic visit.  ? ?No orders of the defined types were placed in this encounter. ? ? ?All questions were answered. The patient knows to call the clinic with any problems, questions or concerns. ? ?A total of more than 30 minutes were spent on this encounter with face-to-face time and non-face-to-face time, including preparing to see the patient, ordering tests and/or medications, counseling the patient and coordination of care as outlined above.  ? ?Ledell Peoples,  MD ?Department of Hematology/Oncology ?[facility] ?Phone: [phone] ?Pager: [phone] ?Email: [email or web address] ? ?02/03/2022 3:42 PM ? ?

## 2022-02-01 ENCOUNTER — Other Ambulatory Visit (HOSPITAL_COMMUNITY): Payer: Self-pay

## 2022-02-08 ENCOUNTER — Ambulatory Visit (INDEPENDENT_AMBULATORY_CARE_PROVIDER_SITE_OTHER): Payer: No Typology Code available for payment source

## 2022-02-08 DIAGNOSIS — L501 Idiopathic urticaria: Secondary | ICD-10-CM

## 2022-02-18 ENCOUNTER — Ambulatory Visit: Payer: Self-pay | Admitting: Allergy

## 2022-02-19 ENCOUNTER — Other Ambulatory Visit (HOSPITAL_COMMUNITY): Payer: Self-pay

## 2022-02-26 ENCOUNTER — Other Ambulatory Visit (HOSPITAL_COMMUNITY): Payer: Self-pay

## 2022-03-03 ENCOUNTER — Other Ambulatory Visit (HOSPITAL_COMMUNITY): Payer: Self-pay

## 2022-03-08 ENCOUNTER — Ambulatory Visit: Payer: Self-pay

## 2022-03-12 ENCOUNTER — Other Ambulatory Visit (HOSPITAL_COMMUNITY): Payer: Self-pay

## 2022-04-08 ENCOUNTER — Ambulatory Visit (INDEPENDENT_AMBULATORY_CARE_PROVIDER_SITE_OTHER): Payer: No Typology Code available for payment source

## 2022-04-08 DIAGNOSIS — L501 Idiopathic urticaria: Secondary | ICD-10-CM

## 2022-04-19 ENCOUNTER — Telehealth: Payer: Self-pay | Admitting: Hematology and Oncology

## 2022-04-19 NOTE — Telephone Encounter (Signed)
Called patient regarding upcoming November and 2024 appointments, left a voicemail.

## 2022-04-20 ENCOUNTER — Telehealth: Payer: Self-pay | Admitting: *Deleted

## 2022-04-20 ENCOUNTER — Other Ambulatory Visit (HOSPITAL_COMMUNITY): Payer: Self-pay

## 2022-04-20 NOTE — Telephone Encounter (Signed)
L/m for patient to contact me to get MD appt made for Xolair reapproval

## 2022-04-22 ENCOUNTER — Other Ambulatory Visit (HOSPITAL_COMMUNITY): Payer: Self-pay

## 2022-05-06 ENCOUNTER — Ambulatory Visit (INDEPENDENT_AMBULATORY_CARE_PROVIDER_SITE_OTHER): Payer: No Typology Code available for payment source

## 2022-05-06 DIAGNOSIS — L501 Idiopathic urticaria: Secondary | ICD-10-CM

## 2022-05-06 NOTE — Progress Notes (Signed)
Immunotherapy   Patient Details  Name: Crystal Simpson MRN: [id number] Date of Birth: 29-Sep-1990  05/06/2022  [name] started injections for  Xolair home administration.  Following schedule: Every four weeks Frequency: Every four weeks.  Epi-Pen:Epi-Pen Available  Consent signed previously and patient instructions given on how to administer in the abdomen and thigh.   Patient administer injections on both sides of her abdomen after being taught. Patient stated that she usually administers injections to others and was comfortable injection herself.   Patient waited in her car for thirty minutes and had no issues or concerns after coming back to be checked.   Ralene Muskrat 05/06/2022, 2:48 PM

## 2022-05-07 ENCOUNTER — Ambulatory Visit: Payer: Self-pay

## 2022-05-27 NOTE — Progress Notes (Signed)
[address] [place] [address] Dept: [phone]  FOLLOW UP NOTE  Patient ID: Crystal Simpson, female    DOB: [birth year]  Age: 31 y.o. MRN: [id number] Date of Office Visit: 05/28/2022  Assessment  Chief Complaint: Urticaria (Occ hive breakouts. Has had about 3-4 in the last year. ), Allergic Rhinitis  (Occ congestion ), and Allergic Reaction (Is still avoiding corn and shellfish. Will occ eat egg white and bread)  HPI Crystal Simpson is a 31 year old female who presents to the clinic for follow-up visit.  She was last seen in this clinic on 04/22/2021 by Thermon Leyland, FNP, for evaluation of allergic rhinitis, allergic conjunctivitis, and urticaria controlled on Xolair injections.  At today's visit, she reports her urticaria has been much more well controlled since her last visit to this clinic.  She reports about 3-4 breakouts over the last year occurring mainly in local areas with small itchy welts.  She reports that at that time she takes an additional dose of cetirizine with relief of symptoms.  She denies concomitant cardiopulmonary and gastrointestinal symptoms with these hives.  She continues cetirizine 10 mg once a day and famotidine 20 mg once a day.  She stopped taking montelukast about 1 year ago.  She continues Xolair 300 mg injections once every 28 days.  She denies large or local reactions with Xolair injections.  She reports a significant decrease in her symptoms of chronic urticaria while continuing on Xolair injections.  She will be moving to self administration of Xolair injections with her next injection.  Allergic rhinitis is reported as moderately well controlled with nasal congestion as the main symptom.  She continues cetirizine daily and is not currently using a nasal saline rinse or nasal steroid spray.  Her last environmental allergy testing was on 07/11/2019 via lab from an outside facility and was positive to pollen, dust mite, mold, cockroach, and dog.  She  continues to avoid shellfish and corn with no accidental ingestion or EpiPen use since her last visit to this clinic.  She continues to consume wheat and egg with no adverse reaction.  Her last food allergy testing via lab from an outside clinic indicated slightly elevated levels to egg, wheat, and corn, and moderately elevated level to shrimp.  Her current medications are listed in the chart.   Drug Allergies:  Allergies  Allergen Reactions   Eggs Or Egg-Derived Products Hives   Shrimp [Shellfish Allergy]     Unknown reaction   Wheat Bran     Unknown reaction     Physical Exam: BP 112/76   Pulse 68   Temp 99.5 F (37.5 C)   Resp 18   Ht 5\' 5"  (1.651 m)   Wt 194 lb 6 oz (88.2 kg)   SpO2 97%   BMI 32.35 kg/m    Physical Exam Vitals reviewed.  Constitutional:      Appearance: Normal appearance.  HENT:     Head: Normocephalic and atraumatic.     Right Ear: Tympanic membrane normal.     Left Ear: Tympanic membrane normal.     Nose:     Comments: Bilateral nares edematous and pale with clear nasal drainage noted.  Pharynx normal.  Ears normal.  Eyes normal.    Mouth/Throat:     Pharynx: Oropharynx is clear.  Eyes:     Conjunctiva/sclera: Conjunctivae normal.  Cardiovascular:     Rate and Rhythm: Normal rate and regular rhythm.     Heart sounds: Normal heart  sounds. No murmur heard. Pulmonary:     Effort: Pulmonary effort is normal.     Breath sounds: Normal breath sounds.     Comments: Lungs clear to auscultation Musculoskeletal:        General: Normal range of motion.     Cervical back: Normal range of motion and neck supple.  Skin:    General: Skin is warm and dry.  Neurological:     Mental Status: She is alert and oriented to person, place, and time.  Psychiatric:        Mood and Affect: Mood normal.        Behavior: Behavior normal.        Thought Content: Thought content normal.        Judgment: Judgment normal.     Assessment and Plan: 1. Chronic  urticaria   2. Seasonal and perennial allergic rhinitis   3. Allergy with anaphylaxis due to food     Meds ordered this encounter  Medications   DISCONTD: EPINEPHrine (EPIPEN 2-PAK) 0.3 mg/0.3 mL IJ SOAJ injection    Sig: Inject 0.3 mg into the muscle once for 1 dose.    Dispense:  0.3 mL    Refill:  0   EPINEPHrine (EPIPEN 2-PAK) 0.3 mg/0.3 mL IJ SOAJ injection    Sig: Inject 0.3 mg into the muscle once for 1 dose.    Dispense:  1 each    Refill:  2    Patient Instructions  Allergic rhinitis Continue allergen avoidance measures directed toward grass pollen, weed pollen, tree pollen, dust mite, mold, cockroach, and dog as listed below Continue cetirizine 10 mg once a day as needed for runny nose or itch Continue Flonase 2 sprays in each nostril once a day as needed for stuffy nose. In the right nostril, point the applicator out toward the right ear. In the left nostril, point the applicator out toward the left ear Consider saline nasal rinses as needed for nasal symptoms. Use this before any medicated nasal sprays for best result Consider allergen immunotherapy if your symptoms are not well controlled with the treatment plan as listed above  Hives (urticaria) Use the least amount of medications while remaining hive free Cetirizine (Zyrtec) 10mg  twice a day and famotidine (Pepcid) 20 mg twice a day. If no symptoms for 7-14 days then decrease to. Cetirizine (Zyrtec) 10mg  twice a day and famotidine (Pepcid) 20 mg once a day.  If no symptoms for 7-14 days then decrease to. Cetirizine (Zyrtec) 10mg  twice a day.  If no symptoms for 7-14 days then decrease to. Cetirizine (Zyrtec) 10mg  once a day.  May use Benadryl (diphenhydramine) as needed for breakthrough hives       If symptoms return, then step up dosage Keep a detailed symptom journal including foods eaten, contact with allergens, medications taken, weather changes.  Continue Xolair injections once every 28 days and have access to  an epinephrine auto-injector set.   Food allergy Continue to avoid shellfish and corn.  In case of an allergic reaction, take Benadryl 50 mg capsules every 4 hours, and if life-threatening symptoms occur, inject with EpiPen 0.3 mg.  Call the clinic if this treatment plan is not working well for you.  Follow up in 1 year or sooner if needed.   Return in about 1 year (around 05/29/2023), or if symptoms worsen or fail to improve.    Thank you for the opportunity to care for this patient.  Please do not hesitate to contact me  with questions.  Gareth Morgan, FNP [facility]

## 2022-05-27 NOTE — Patient Instructions (Addendum)
Allergic rhinitis Continue allergen avoidance measures directed toward grass pollen, weed pollen, tree pollen, dust mite, mold, cockroach, and dog as listed below Continue cetirizine 10 mg once a day as needed for runny nose or itch Continue Flonase 2 sprays in each nostril once a day as needed for stuffy nose. In the right nostril, point the applicator out toward the right ear. In the left nostril, point the applicator out toward the left ear Consider saline nasal rinses as needed for nasal symptoms. Use this before any medicated nasal sprays for best result Consider allergen immunotherapy if your symptoms are not well controlled with the treatment plan as listed above  Hives (urticaria) Use the least amount of medications while remaining hive free Cetirizine (Zyrtec) 10mg  twice a day and famotidine (Pepcid) 20 mg twice a day. If no symptoms for 7-14 days then decrease to. Cetirizine (Zyrtec) 10mg  twice a day and famotidine (Pepcid) 20 mg once a day.  If no symptoms for 7-14 days then decrease to. Cetirizine (Zyrtec) 10mg  twice a day.  If no symptoms for 7-14 days then decrease to. Cetirizine (Zyrtec) 10mg  once a day.  May use Benadryl (diphenhydramine) as needed for breakthrough hives       If symptoms return, then step up dosage Keep a detailed symptom journal including foods eaten, contact with allergens, medications taken, weather changes.  Continue Xolair injections once every 28 days and have access to an epinephrine auto-injector set.   Food allergy Continue to avoid shellfish and corn.  In case of an allergic reaction, take Benadryl 50 mg capsules every 4 hours, and if life-threatening symptoms occur, inject with EpiPen 0.3 mg.  Call the clinic if this treatment plan is not working well for you.  Follow up in 1 year or sooner if needed.  Reducing Pollen Exposure The American Academy of Allergy, Asthma and Immunology suggests the following steps to reduce your exposure to pollen  during allergy seasons. Do not hang sheets or clothing out to dry; pollen may collect on these items. Do not mow lawns or spend time around freshly cut grass; mowing stirs up pollen. Keep windows closed at night.  Keep car windows closed while driving. Minimize morning activities outdoors, a time when pollen counts are usually at their highest. Stay indoors as much as possible when pollen counts or humidity is high and on windy days when pollen tends to remain in the air longer. Use air conditioning when possible.  Many air conditioners have filters that trap the pollen spores. Use a HEPA room air filter to remove pollen form the indoor air you breathe.   Control of Dust Mite Allergen Dust mites play a major role in allergic asthma and rhinitis. They occur in environments with high humidity wherever human skin is found. Dust mites absorb humidity from the atmosphere (ie, they do not drink) and feed on organic matter (including shed human and animal skin). Dust mites are a microscopic type of insect that you cannot see with the naked eye. High levels of dust mites have been detected from mattresses, pillows, carpets, upholstered furniture, bed covers, clothes, soft toys and any woven material. The principal allergen of the dust mite is found in its feces. A gram of dust may contain 1,000 mites and 250,000 fecal particles. Mite antigen is easily measured in the air during house cleaning activities. Dust mites do not bite and do not cause harm to humans, other than by triggering allergies/asthma.  Ways to decrease your exposure to dust mites  in your home:  1. Encase mattresses, box springs and pillows with a mite-impermeable barrier or cover  2. Wash sheets, blankets and drapes weekly in hot water (130 F) with detergent and dry them in a dryer on the hot setting.  3. Have the room cleaned frequently with a vacuum cleaner and a damp dust-mop. For carpeting or rugs, vacuuming with a vacuum cleaner  equipped with a high-efficiency particulate air (HEPA) filter. The dust mite allergic individual should not be in a room which is being cleaned and should wait 1 hour after cleaning before going into the room.  4. Do not sleep on upholstered furniture (eg, couches).  5. If possible removing carpeting, upholstered furniture and drapery from the home is ideal. Horizontal blinds should be eliminated in the rooms where the person spends the most time (bedroom, study, television room). Washable vinyl, roller-type shades are optimal.  6. Remove all non-washable stuffed toys from the bedroom. Wash stuffed toys weekly like sheets and blankets above.  7. Reduce indoor humidity to less than 50%. Inexpensive humidity monitors can be purchased at most hardware stores. Do not use a humidifier as can make the problem worse and are not recommended.  Control of Dog or Cat Allergen Avoidance is the best way to manage a dog or cat allergy. If you have a dog or cat and are allergic to dog or cats, consider removing the dog or cat from the home. If you have a dog or cat but don't want to find it a new home, or if your family wants a pet even though someone in the household is allergic, here are some strategies that may help keep symptoms at bay:  Keep the pet out of your bedroom and restrict it to only a few rooms. Be advised that keeping the dog or cat in only one room will not limit the allergens to that room. Don't pet, hug or kiss the dog or cat; if you do, wash your hands with soap and water. High-efficiency particulate air (HEPA) cleaners run continuously in a bedroom or living room can reduce allergen levels over time. Regular use of a high-efficiency vacuum cleaner or a central vacuum can reduce allergen levels. Giving your dog or cat a bath at least once a week can reduce airborne allergen.  Control of Cockroach Allergen  Cockroach allergen has been identified as an important cause of acute attacks of  asthma, especially in urban settings.  There are fifty-five species of cockroach that exist in the [place], however only three, the [place], [place] species produce allergen that can affect patients with Asthma.  Allergens can be obtained from fecal particles, egg casings and secretions from cockroaches.    Remove food sources. Reduce access to water. Seal access and entry points. Spray runways with 0.5-1% Diazinon or Chlorpyrifos Blow boric acid power under stoves and refrigerator. Place bait stations (hydramethylnon) at feeding sites.

## 2022-05-28 ENCOUNTER — Ambulatory Visit: Payer: Self-pay

## 2022-05-28 ENCOUNTER — Ambulatory Visit: Payer: No Typology Code available for payment source | Admitting: Family Medicine

## 2022-05-28 ENCOUNTER — Other Ambulatory Visit (HOSPITAL_COMMUNITY): Payer: Self-pay

## 2022-05-28 ENCOUNTER — Encounter: Payer: Self-pay | Admitting: Family Medicine

## 2022-05-28 VITALS — BP 112/76 | HR 68 | Temp 99.5°F | Resp 18 | Ht 65.0 in | Wt 194.4 lb

## 2022-05-28 DIAGNOSIS — J3089 Other allergic rhinitis: Secondary | ICD-10-CM | POA: Diagnosis not present

## 2022-05-28 DIAGNOSIS — T7800XA Anaphylactic reaction due to unspecified food, initial encounter: Secondary | ICD-10-CM | POA: Insufficient documentation

## 2022-05-28 DIAGNOSIS — J302 Other seasonal allergic rhinitis: Secondary | ICD-10-CM

## 2022-05-28 DIAGNOSIS — L508 Other urticaria: Secondary | ICD-10-CM

## 2022-05-28 MED ORDER — EPINEPHRINE 0.3 MG/0.3ML IJ SOAJ
0.3000 mg | INTRAMUSCULAR | 2 refills | Status: AC
  Filled 2022-05-28: qty 2, 2d supply, fill #0

## 2022-05-28 MED ORDER — EPINEPHRINE 0.3 MG/0.3ML IJ SOAJ
0.3000 mg | Freq: Once | INTRAMUSCULAR | 0 refills | Status: DC
  Filled 2022-05-28: qty 0.3, 1d supply, fill #0

## 2022-06-02 ENCOUNTER — Other Ambulatory Visit (HOSPITAL_COMMUNITY): Payer: Self-pay

## 2022-06-07 ENCOUNTER — Other Ambulatory Visit (HOSPITAL_COMMUNITY): Payer: Self-pay

## 2022-06-10 ENCOUNTER — Ambulatory Visit: Payer: Self-pay | Admitting: Allergy

## 2022-06-11 NOTE — Progress Notes (Signed)
Patient ID: Crystal Simpson, female    DOB: January 13, 1991  MRN: [id number]  CC: Annual Physical Exam  Subjective: Crystal Simpson is a 31 y.o. female who presents for annual physical exam.   Her concerns today include:  - Established with Hematology for bandemia management. - Established with Gynecology and plans to schedule PAP smear February 2024. - She has an eye doctor and gets routine exams. - Requests STD screening. Has 1 partner who is a new partner. No current symptoms of concern.   Patient Active Problem List   Diagnosis Date Noted   Allergy with anaphylaxis due to food 05/28/2022   Chronic urticaria 04/22/2021   Allergic conjunctivitis of both eyes 04/22/2021   Seasonal and perennial allergic rhinitis 04/22/2021     Current Outpatient Medications on File Prior to Visit  Medication Sig Dispense Refill   AUVI-Q 0.3 MG/0.3ML SOAJ injection      cetirizine (ZYRTEC) 10 MG tablet Take 1 tablet (10 mg total) by mouth 2 (two) times daily. 60 tablet 5   famotidine (PEPCID) 20 MG tablet Take 1 tablet (20 mg total) by mouth 2 (two) times daily. 60 tablet 5   norgestimate-ethinyl estradiol (FEMYNOR) 0.25-35 MG-MCG tablet Take 1 tablet by mouth daily. SKIP PLACEBO PILLS AND RESTART A NEW PACK EVERY 21 DAYS. 84 tablet 4   omalizumab (XOLAIR) 150 MG/ML prefilled syringe INJECT 2 PENS (300 MG) INTO THE SKIN EVERY 28 DAYS. 2 mL 11   EPINEPHrine (EPIPEN 2-PAK) 0.3 mg/0.3 mL IJ SOAJ injection Inject 0.3 mg into the muscle once for 1 dose. 2 each 2   Current Facility-Administered Medications on File Prior to Visit  Medication Dose Route Frequency Provider Last Rate Last Admin   omalizumab Geoffry Paradise) injection 300 mg  300 mg Subcutaneous Q28 days Marcelyn Bruins, MD   300 mg at 05/06/22 1411    Allergies  Allergen Reactions   Eggs Or Egg-Derived Products Hives   Shrimp [Shellfish Allergy]     Unknown reaction   Wheat Bran     Unknown reaction     Social History    Socioeconomic History   Marital status: Legally Separated    Spouse name: Not on file   Number of children: Not on file   Years of education: Not on file   Highest education level: Not on file  Occupational History   Occupation: nurse  Tobacco Use   Smoking status: Never   Smokeless tobacco: Never  Vaping Use   Vaping Use: Never used  Substance and Sexual Activity   Alcohol use: Yes    Comment: occ   Drug use: No   Sexual activity: Yes    Birth control/protection: I.U.D.  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[employer]   Social Determinants of Health   Financial Resource Strain: Low Risk  (07/11/2019)   Overall Financial Resource Strain (CARDIA)    Difficulty of Paying Living Expenses: Not hard at all  Food Insecurity: No Food Insecurity (07/11/2019)   Hunger Vital Sign    Worried About Running Out of Food in the Last Year: Never true    Ran Out of Food in the Last Year: Never true  Transportation Needs: No Transportation Needs (07/11/2019)   PRAPARE - Administrator, Civil Service (Medical): No    Lack of Transportation (Non-Medical): No  Physical Activity: Not on file  Stress: Not on file  Social Connections: Unknown (07/11/2019)  Social Advertising account executive [NHANES]    Frequency of Communication with Friends and Family: Not on file    Frequency of Social Gatherings with Friends and Family: Not on file    Attends Religious Services: Not on file    Active Member of Clubs or Organizations: Not on file    Attends Banker Meetings: Not on file    Marital Status: Married  Intimate Partner Violence: Not At Risk (07/11/2019)   Humiliation, Afraid, Rape, and Kick questionnaire    Fear of Current or Ex-Partner: No    Emotionally Abused: No    Physically Abused: No    Sexually Abused: No    Family History  Problem Relation Age of Onset   Rheum arthritis Mother    Eczema Mother    Healthy Father     Healthy Sister    Healthy Brother    Healthy Daughter    Diabetes Maternal Grandmother    Hypertension Maternal Grandfather    Parkinson's disease Paternal Grandmother     Past Surgical History:  Procedure Laterality Date   CESAREAN SECTION     WISDOM TOOTH EXTRACTION      ROS: Review of Systems Negative except as stated above  PHYSICAL EXAM: BP 127/85   Pulse 77   Ht 5' 4.5" (1.638 m)   Wt 192 lb 9.6 oz (87.4 kg)   SpO2 95%   BMI 32.55 kg/m   Physical Exam HENT:     Head: Normocephalic and atraumatic.     Right Ear: Tympanic membrane, ear canal and external ear normal.     Left Ear: Tympanic membrane, ear canal and external ear normal.     Nose: Nose normal.     Mouth/Throat:     Mouth: Mucous membranes are moist.     Pharynx: Oropharynx is clear.  Eyes:     Extraocular Movements: Extraocular movements intact.     Conjunctiva/sclera: Conjunctivae normal.     Pupils: Pupils are equal, round, and reactive to light.  Cardiovascular:     Rate and Rhythm: Normal rate and regular rhythm.     Pulses: Normal pulses.     Heart sounds: Normal heart sounds.  Pulmonary:     Effort: Pulmonary effort is normal.     Breath sounds: Normal breath sounds.  Chest:     Comments: Patient declined.  Abdominal:     General: Bowel sounds are normal.     Palpations: Abdomen is soft.  Genitourinary:    Comments: Patient declined. Musculoskeletal:        General: Normal range of motion.     Right shoulder: Normal.     Left shoulder: Normal.     Right upper arm: Normal.     Left upper arm: Normal.     Right elbow: Normal.     Left elbow: Normal.     Right forearm: Normal.     Left forearm: Normal.     Right wrist: Normal.     Left wrist: Normal.     Right hand: Normal.     Left hand: Normal.     Cervical back: Normal, normal range of motion and neck supple.     Thoracic back: Normal.     Lumbar back: Normal.     Right hip: Normal.     Left hip: Normal.     Right upper  leg: Normal.     Left upper leg: Normal.     Right knee: Normal.  Left knee: Normal.     Right lower leg: Normal.     Left lower leg: Normal.     Right ankle: Normal.     Left ankle: Normal.     Right foot: Normal.     Left foot: Normal.  Skin:    General: Skin is warm and dry.     Capillary Refill: Capillary refill takes less than 2 seconds.  Neurological:     General: No focal deficit present.     Mental Status: She is alert and oriented to person, place, and time.  Psychiatric:        Mood and Affect: Mood normal.        Behavior: Behavior normal.    Results for orders placed or performed in visit on 06/25/22  POCT URINALYSIS DIP (CLINITEK)  Result Value Ref Range   Color, UA yellow yellow   Clarity, UA clear clear   Glucose, UA negative negative mg/dL   Bilirubin, UA negative negative   Ketones, POC UA negative negative mg/dL   Spec Grav, UA 1.610 9.604 - 1.025   Blood, UA negative negative   pH, UA 6.5 5.0 - 8.0   POC PROTEIN,UA =30 (A) negative, trace   Urobilinogen, UA 0.2 0.2 or 1.0 E.U./dL   Nitrite, UA Negative Negative   Leukocytes, UA Negative Negative    ASSESSMENT AND PLAN: 1. Annual physical exam - Counseled on 150 minutes of exercise per week as tolerated, healthy eating (including decreased daily intake of saturated fats, cholesterol, added sugars, sodium), STI prevention, and routine healthcare maintenance.  2. Diabetes mellitus screening - Routine screening.  - Hemoglobin A1c  3. Screening cholesterol level - Routine screening.  - Lipid panel  4. Thyroid disorder screen - Routine screening.  - TSH  5. Routine screening for STI (sexually transmitted infection) - Routine screening.  - No urinary tract infection. - Cervicovaginal ancillary only - RPR w/reflex to TrepSure - HIV antibody (with reflex) - POCT URINALYSIS DIP (CLINITEK)  6. Pap smear for cervical cancer screening - Patient reports she is established with Gynecology and  plans to schedule appointment February 2024.   7. Bandemia - Keep all scheduled appointments with Hematology.   8. Influenza vaccine refused - Patient declined.    Patient was given the opportunity to ask questions.  Patient verbalized understanding of the plan and was able to repeat key elements of the plan. Patient was given clear instructions to go to Emergency Department or return to medical center if symptoms don't improve, worsen, or new problems develop.The patient verbalized understanding.   Orders Placed This Encounter  Procedures   TSH   Lipid panel   Hemoglobin A1c   RPR w/reflex to TrepSure   HIV antibody (with reflex)   POCT URINALYSIS DIP (CLINITEK)     Return in about 1 year (around 06/26/2023) for Physical per patient preference.  Rema Fendt, NP

## 2022-06-14 ENCOUNTER — Other Ambulatory Visit (HOSPITAL_COMMUNITY): Payer: Self-pay

## 2022-06-21 ENCOUNTER — Other Ambulatory Visit (HOSPITAL_COMMUNITY): Payer: Self-pay

## 2022-06-24 ENCOUNTER — Other Ambulatory Visit: Payer: Self-pay | Admitting: Family Medicine

## 2022-06-25 ENCOUNTER — Telehealth: Payer: Self-pay

## 2022-06-25 ENCOUNTER — Other Ambulatory Visit (HOSPITAL_COMMUNITY): Payer: Self-pay

## 2022-06-25 ENCOUNTER — Other Ambulatory Visit (HOSPITAL_COMMUNITY)
Admission: RE | Admit: 2022-06-25 | Discharge: 2022-06-25 | Disposition: A | Discharge status: Home / Self Care | Payer: No Typology Code available for payment source | Source: Ambulatory Visit | Attending: Family | Admitting: Family

## 2022-06-25 ENCOUNTER — Ambulatory Visit (INDEPENDENT_AMBULATORY_CARE_PROVIDER_SITE_OTHER): Payer: No Typology Code available for payment source | Admitting: Family

## 2022-06-25 VITALS — BP 127/85 | HR 77 | Ht 64.5 in | Wt 192.6 lb

## 2022-06-25 DIAGNOSIS — Z1329 Encounter for screening for other suspected endocrine disorder: Secondary | ICD-10-CM | POA: Diagnosis not present

## 2022-06-25 DIAGNOSIS — Z0000 Encounter for general adult medical examination without abnormal findings: Secondary | ICD-10-CM

## 2022-06-25 DIAGNOSIS — D72825 Bandemia: Secondary | ICD-10-CM

## 2022-06-25 DIAGNOSIS — Z131 Encounter for screening for diabetes mellitus: Secondary | ICD-10-CM | POA: Diagnosis not present

## 2022-06-25 DIAGNOSIS — Z113 Encounter for screening for infections with a predominantly sexual mode of transmission: Secondary | ICD-10-CM | POA: Insufficient documentation

## 2022-06-25 DIAGNOSIS — Z13220 Encounter for screening for lipoid disorders: Secondary | ICD-10-CM | POA: Diagnosis not present

## 2022-06-25 DIAGNOSIS — Z2821 Immunization not carried out because of patient refusal: Secondary | ICD-10-CM

## 2022-06-25 DIAGNOSIS — Z124 Encounter for screening for malignant neoplasm of cervix: Secondary | ICD-10-CM

## 2022-06-25 LAB — POCT URINALYSIS DIP (CLINITEK)
Bilirubin, UA: NEGATIVE
Blood, UA: NEGATIVE
Glucose, UA: NEGATIVE mg/dL
Ketones, POC UA: NEGATIVE mg/dL
Leukocytes, UA: NEGATIVE
Nitrite, UA: NEGATIVE
POC PROTEIN,UA: 30 — AB
Spec Grav, UA: 1.025 (ref 1.010–1.025)
Urobilinogen, UA: 0.2 E.U./dL
pH, UA: 6.5 (ref 5.0–8.0)

## 2022-06-25 NOTE — Telephone Encounter (Signed)
Patient returning a call regarding her lab results

## 2022-06-25 NOTE — Progress Notes (Signed)
Patient is here for a physical. Patient stated to having no question or concerns at this moment.  Patient declined the flu vaccine at this time

## 2022-06-25 NOTE — Patient Instructions (Signed)

## 2022-06-25 NOTE — Telephone Encounter (Signed)
Called, spoke with patient about urine lab results.

## 2022-06-25 NOTE — Telephone Encounter (Signed)
Pt was called and vm was left, Information has been sent to nurse pool.   

## 2022-06-25 NOTE — Telephone Encounter (Signed)
-----   Message from Camillia Herter, NP sent at 06/25/2022 12:35 PM EDT ----- No urinary tract infection.

## 2022-06-26 ENCOUNTER — Encounter: Payer: Self-pay | Admitting: Family

## 2022-06-26 ENCOUNTER — Other Ambulatory Visit: Payer: Self-pay | Admitting: Family

## 2022-06-26 DIAGNOSIS — E785 Hyperlipidemia, unspecified: Secondary | ICD-10-CM | POA: Insufficient documentation

## 2022-06-26 LAB — LIPID PANEL
Chol/HDL Ratio: 3.6 ratio (ref 0.0–4.4)
Cholesterol, Total: 202 mg/dL — ABNORMAL HIGH (ref 100–199)
HDL: 56 mg/dL (ref >39)
LDL Chol Calc (NIH): 132 mg/dL — ABNORMAL HIGH (ref 0–99)
Triglycerides: 80 mg/dL (ref 0–149)
VLDL Cholesterol Cal: 14 mg/dL (ref 5–40)

## 2022-06-26 LAB — HEMOGLOBIN A1C
Est. average glucose Bld gHb Est-mCnc: 103 mg/dL
Hgb A1c MFr Bld: 5.2 % (ref 4.8–5.6)

## 2022-06-26 LAB — TSH: TSH: 1.18 u[IU]/mL (ref 0.450–4.500)

## 2022-06-26 LAB — HIV ANTIBODY (ROUTINE TESTING W REFLEX): HIV Screen 4th Generation wRfx: NONREACTIVE

## 2022-06-26 MED ORDER — ATORVASTATIN CALCIUM 10 MG PO TABS
10.0000 mg | ORAL_TABLET | Freq: Every day | ORAL | 2 refills | Status: AC
  Filled 2022-06-26: qty 30, 30d supply, fill #0

## 2022-06-28 ENCOUNTER — Other Ambulatory Visit (HOSPITAL_COMMUNITY): Payer: Self-pay

## 2022-06-28 ENCOUNTER — Other Ambulatory Visit: Payer: Self-pay | Admitting: Family

## 2022-06-28 ENCOUNTER — Telehealth: Payer: Self-pay

## 2022-06-28 DIAGNOSIS — N760 Acute vaginitis: Secondary | ICD-10-CM

## 2022-06-28 LAB — CERVICOVAGINAL ANCILLARY ONLY
Bacterial Vaginitis (gardnerella): POSITIVE — AB
Candida Glabrata: NEGATIVE
Candida Vaginitis: NEGATIVE
Chlamydia: NEGATIVE
Comment: NEGATIVE
Comment: NEGATIVE
Comment: NEGATIVE
Comment: NEGATIVE
Comment: NEGATIVE
Comment: NORMAL
Neisseria Gonorrhea: NEGATIVE
Trichomonas: NEGATIVE

## 2022-06-28 MED ORDER — FAMOTIDINE 20 MG PO TABS
20.0000 mg | ORAL_TABLET | Freq: Two times a day (BID) | ORAL | 5 refills | Status: AC
Start: 2022-06-28 — End: ?
  Filled 2022-06-28: qty 60, 30d supply, fill #0
  Filled 2022-11-04 – 2022-12-30 (×3): qty 60, 30d supply, fill #1
  Filled 2023-03-15: qty 60, 30d supply, fill #2

## 2022-06-28 MED ORDER — METRONIDAZOLE 500 MG PO TABS
500.0000 mg | ORAL_TABLET | Freq: Two times a day (BID) | ORAL | 0 refills | Status: AC
  Filled 2022-06-28: qty 14, 7d supply, fill #0

## 2022-06-28 NOTE — Telephone Encounter (Signed)
Pt was called and vm was left, Information has been sent to nurse pool.   

## 2022-06-28 NOTE — Telephone Encounter (Signed)
-----   Message from Camillia Herter, NP sent at 06/26/2022 11:21 AM EDT ----- - Thyroid function normal. - No diabetes. - HIV negative. - Cholesterol mildly higher than expected. Consider eating more fruits, vegetables, and lean baked meats such as chicken or fish. Moderate intensity exercise at least 150 minutes as tolerated per week may help as well. Trial Atorvastatin for high cholesterol. Please call our office to schedule lab appointment to recheck fasting cholesterol in 4 to 6 weeks.

## 2022-06-29 ENCOUNTER — Other Ambulatory Visit (HOSPITAL_COMMUNITY): Payer: Self-pay

## 2022-06-29 ENCOUNTER — Other Ambulatory Visit (HOSPITAL_BASED_OUTPATIENT_CLINIC_OR_DEPARTMENT_OTHER): Payer: Self-pay

## 2022-06-30 ENCOUNTER — Other Ambulatory Visit (HOSPITAL_COMMUNITY): Payer: Self-pay

## 2022-06-30 ENCOUNTER — Telehealth: Payer: Self-pay

## 2022-06-30 NOTE — Telephone Encounter (Signed)
-----   Message from Camillia Herter, NP sent at 06/28/2022  1:24 PM EDT ----- - Gonorrhea, Chlamydia, Trichomonas, Candida Vaginitis (yeast infection) negative.  - Positive for Bacterial Vaginitis. Prescribed Metronidazole (Flagyl). Do not drink alcohol while taking this antibiotic as this may cause severe nausea, vomiting, and upset stomach.

## 2022-06-30 NOTE — Telephone Encounter (Signed)
Called patient and she is aware of results(seen in mychart) and medication. No Further question or concerns

## 2022-07-12 ENCOUNTER — Encounter: Payer: Self-pay | Admitting: Family

## 2022-07-12 ENCOUNTER — Other Ambulatory Visit (HOSPITAL_COMMUNITY): Payer: Self-pay

## 2022-07-12 ENCOUNTER — Other Ambulatory Visit: Payer: Self-pay | Admitting: Family

## 2022-07-12 DIAGNOSIS — B3731 Acute candidiasis of vulva and vagina: Secondary | ICD-10-CM

## 2022-07-12 MED ORDER — FLUCONAZOLE 150 MG PO TABS
150.0000 mg | ORAL_TABLET | ORAL | 0 refills | Status: AC
  Filled 2022-07-12: qty 2, 6d supply, fill #0

## 2022-07-21 ENCOUNTER — Other Ambulatory Visit (HOSPITAL_COMMUNITY): Payer: Self-pay

## 2022-07-23 ENCOUNTER — Other Ambulatory Visit (HOSPITAL_COMMUNITY): Payer: Self-pay

## 2022-07-26 ENCOUNTER — Other Ambulatory Visit (HOSPITAL_COMMUNITY): Payer: Self-pay

## 2022-07-27 ENCOUNTER — Other Ambulatory Visit (HOSPITAL_COMMUNITY): Payer: Self-pay

## 2022-07-28 ENCOUNTER — Other Ambulatory Visit: Payer: Self-pay | Admitting: *Deleted

## 2022-07-28 ENCOUNTER — Inpatient Hospital Stay: Payer: No Typology Code available for payment source | Attending: Hematology and Oncology

## 2022-07-28 DIAGNOSIS — D72825 Bandemia: Secondary | ICD-10-CM | POA: Insufficient documentation

## 2022-07-28 LAB — CMP (CANCER CENTER ONLY)
ALT: 5 U/L (ref 0–44)
AST: 10 U/L — ABNORMAL LOW (ref 15–41)
Albumin: 3.9 g/dL (ref 3.5–5.0)
Alkaline Phosphatase: 41 U/L (ref 38–126)
Anion gap: 5 (ref 5–15)
BUN: 8 mg/dL (ref 6–20)
CO2: 27 mmol/L (ref 22–32)
Calcium: 9 mg/dL (ref 8.9–10.3)
Chloride: 106 mmol/L (ref 98–111)
Creatinine: 0.65 mg/dL (ref 0.44–1.00)
GFR, Estimated: >60 mL/min (ref >60)
Glucose, Bld: 98 mg/dL (ref 70–99)
Potassium: 4.4 mmol/L (ref 3.5–5.1)
Sodium: 138 mmol/L (ref 135–145)
Total Bilirubin: 0.3 mg/dL (ref 0.3–1.2)
Total Protein: 7.4 g/dL (ref 6.5–8.1)

## 2022-07-28 LAB — CBC WITH DIFFERENTIAL (CANCER CENTER ONLY)
Abs Immature Granulocytes: 0.04 10*3/uL (ref 0.00–0.07)
Basophils Absolute: 0 10*3/uL (ref 0.0–0.1)
Basophils Relative: 0 %
Eosinophils Absolute: 0.1 10*3/uL (ref 0.0–0.5)
Eosinophils Relative: 1 %
HCT: 36.2 % (ref 36.0–46.0)
Hemoglobin: 11.6 g/dL — ABNORMAL LOW (ref 12.0–15.0)
Immature Granulocytes: 0 %
Lymphocytes Relative: 25 %
Lymphs Abs: 2.8 10*3/uL (ref 0.7–4.0)
MCH: 28.5 pg (ref 26.0–34.0)
MCHC: 32 g/dL (ref 30.0–36.0)
MCV: 88.9 fL (ref 80.0–100.0)
Monocytes Absolute: 1.2 10*3/uL — ABNORMAL HIGH (ref 0.1–1.0)
Monocytes Relative: 11 %
Neutro Abs: 7.2 10*3/uL (ref 1.7–7.7)
Neutrophils Relative %: 63 %
Platelet Count: 326 10*3/uL (ref 150–400)
RBC: 4.07 MIL/uL (ref 3.87–5.11)
RDW: 13.5 % (ref 11.5–15.5)
WBC Count: 11.5 10*3/uL — ABNORMAL HIGH (ref 4.0–10.5)
nRBC: 0 % (ref 0.0–0.2)

## 2022-07-28 LAB — LACTATE DEHYDROGENASE: LDH: 112 U/L (ref 98–192)

## 2022-08-11 ENCOUNTER — Encounter: Payer: Self-pay | Admitting: Family

## 2022-08-12 ENCOUNTER — Ambulatory Visit: Payer: Self-pay

## 2022-08-12 ENCOUNTER — Telehealth: Payer: Self-pay | Admitting: Family

## 2022-08-12 ENCOUNTER — Telehealth: Payer: No Typology Code available for payment source | Admitting: Urgent Care

## 2022-08-12 DIAGNOSIS — B3731 Acute candidiasis of vulva and vagina: Secondary | ICD-10-CM

## 2022-08-12 MED ORDER — MICONAZOLE NITRATE 200 MG VA SUPP
200.0000 mg | Freq: Every day | VAGINAL | 0 refills | Status: AC
  Filled 2022-08-12: qty 3, 3d supply, fill #0

## 2022-08-12 MED ORDER — MICONAZOLE NITRATE 2 % VA CREA
TOPICAL_CREAM | VAGINAL | 0 refills | Status: AC
  Filled 2022-08-12: qty 45, fill #0

## 2022-08-12 NOTE — Telephone Encounter (Signed)
This pt is needing appt, PCP not available soon enough, called office, told to sch with Marylene Land for tomorrow 11/17. Went back to pt, pt not on phone, hung up and called back repeatedly, phone sounds like off the hook, can not contact pt. Sch with Marylene Land if calls back per office.

## 2022-08-12 NOTE — Telephone Encounter (Signed)
Pt returned call at 4:15, no appts available. Secured [facility] for pt this evening, reviewed process, pt verbalizes understanding. Pt also requesting F/U lab appt "I was told I need to check cholesterol."  Per protocol [facility] cannot schedule. Please advise. Thank you.

## 2022-08-12 NOTE — Patient Instructions (Signed)
Crystal Simpson, thank you for joining Maretta Bees, PA for today's virtual visit.  While this provider is not your primary care provider (PCP), if your PCP is located in our provider database this encounter information will be shared with them immediately following your visit.   A [facility] MyChart account gives you access to today's visit and all your visits, tests, and labs performed at [facility] " click here if you don't have a [facility] MyChart account or go to [email or web address]  Consent: (Patient) Crystal Simpson provided verbal consent for this virtual visit at the beginning of the encounter.  Current Medications:  Current Outpatient Medications:    miconazole (MICONAZOLE 7) 2 % vaginal cream, Use finger and apply topical cream externally twice daily x 7 days, Disp: 45 g, Rfl: 0   miconazole (MICOTIN) 200 MG vaginal suppository, Place 1 suppository (200 mg total) vaginally at bedtime., Disp: 3 suppository, Rfl: 0   atorvastatin (LIPITOR) 10 MG tablet, Take 1 tablet (10 mg total) by mouth daily., Disp: 30 tablet, Rfl: 2   AUVI-Q 0.3 MG/0.3ML SOAJ injection, , Disp: , Rfl:    cetirizine (ZYRTEC) 10 MG tablet, Take 1 tablet (10 mg total) by mouth 2 (two) times daily., Disp: 60 tablet, Rfl: 5   EPINEPHrine (EPIPEN 2-PAK) 0.3 mg/0.3 mL IJ SOAJ injection, Inject 0.3 mg into the muscle once for 1 dose., Disp: 2 each, Rfl: 2   famotidine (PEPCID) 20 MG tablet, Take 1 tablet (20 mg total) by mouth 2 (two) times daily., Disp: 60 tablet, Rfl: 5   norgestimate-ethinyl estradiol (FEMYNOR) 0.25-35 MG-MCG tablet, Take 1 tablet by mouth daily. SKIP PLACEBO PILLS AND RESTART A NEW PACK EVERY 21 DAYS., Disp: 84 tablet, Rfl: 4   omalizumab (XOLAIR) 150 MG/ML prefilled syringe, INJECT 2 PENS (300 MG) INTO THE SKIN EVERY 28 DAYS., Disp: 2 mL, Rfl: 11  Current Facility-Administered Medications:    omalizumab Geoffry Paradise) injection 300 mg, 300 mg, Subcutaneous, Q28 days, Marcelyn Bruins, MD, 300 mg at 05/06/22 1411   Medications ordered in this encounter:  Meds ordered this encounter  Medications   miconazole (MICOTIN) 200 MG vaginal suppository    Sig: Place 1 suppository (200 mg total) vaginally at bedtime.    Dispense:  3 suppository    Refill:  0    Order Specific Question:   Supervising Provider    Answer:   Merrilee Jansky [[id number]]   miconazole (MICONAZOLE 7) 2 % vaginal cream    Sig: Use finger and apply topical cream externally twice daily x 7 days    Dispense:  45 g    Refill:  0    Order Specific Question:   Supervising Provider    Answer:   Merrilee Jansky [[id number]]     *If you need refills on other medications prior to your next appointment, please contact your pharmacy*  Follow-Up: Call back or seek an in-person evaluation if the symptoms worsen or if the condition fails to improve as anticipated.  [facility] [phone]  Other Instructions You appear to have a vaginal yeast infection. Please insert one suppository every night x 3 nights. Please topically apply the cream externally twice daily x 7 days to help with itchy, irritated external skin Follow up with your PCP if your symptoms persist for a vaginal swab.   If you have been instructed to have an in-person evaluation today at a local Urgent Care facility, please use the link below. It will  take you to a list of all of our available [facility], including address, phone number and hours of operation. Please do not delay care.  [facility]  If you or a family member do not have a primary care provider, use the link below to schedule a visit and establish care. When you choose a [facility] primary care physician or advanced practice provider, you gain a long-term partner in health. Find a Primary Care Provider  Learn more about [facility] in-office and virtual care options: [facility]

## 2022-08-12 NOTE — Telephone Encounter (Signed)
Message from [place] sent at 08/12/2022  1:32 PM EST  Summary: discharge and discomfort   Pt states she was prescribed diflucan for a yeast infection and is still experiencing discharge and discomfort  Providers next appt is 11-28, pt states she can not wait that long  Please reference pts 11-15 mychart message  Please assist further        Attempted to reach pt 3 times. Pt has not read MyChart message. Routing to office.

## 2022-08-12 NOTE — Telephone Encounter (Signed)
Pt called, unable to LVM, got message "call cannot be completed at this time.."  Summary: discharge and discomfort   Pt states she was prescribed diflucan for a yeast infection and is still experiencing discharge and discomfort  Providers next appt is 11-28, pt states she can not wait that long  Please reference pts 11-15 mychart message  Please assist further

## 2022-08-12 NOTE — Progress Notes (Signed)
Virtual Visit Consent   Crystal Simpson, you are scheduled for a virtual visit with a [facility] provider today. Just as with appointments in the office, your consent must be obtained to participate. Your consent will be active for this visit and any virtual visit you may have with one of our providers in the next 365 days. If you have a MyChart account, a copy of this consent can be sent to you electronically.  As this is a virtual visit, video technology does not allow for your provider to perform a traditional examination. This may limit your provider's ability to fully assess your condition. If your provider identifies any concerns that need to be evaluated in person or the need to arrange testing (such as labs, EKG, etc.), we will make arrangements to do so. Although advances in technology are sophisticated, we cannot ensure that it will always work on either your end or our end. If the connection with a video visit is poor, the visit may have to be switched to a telephone visit. With either a video or telephone visit, we are not always able to ensure that we have a secure connection.  By engaging in this virtual visit, you consent to the provision of healthcare and authorize for your insurance to be billed (if applicable) for the services provided during this visit. Depending on your insurance coverage, you may receive a charge related to this service.  I need to obtain your verbal consent now. Are you willing to proceed with your visit today? Crystal Simpson has provided verbal consent on 08/12/2022 for a virtual visit (video or telephone). Crystal Bees, PA  Date: 08/12/2022 5:47 PM  Virtual Visit via Video Note   I, Crystal Simpson, connected with  Crystal Simpson  ([id number], 1991-09-20) on 08/12/22 at  5:15 PM EST by a video-enabled telemedicine application and verified that I am speaking with the correct person using two identifiers.  Location: Patient: Virtual Visit Location Patient:  Home Provider: Virtual Visit Location Provider: Home Office   I discussed the limitations of evaluation and management by telemedicine and the availability of in person appointments. The patient expressed understanding and agreed to proceed.    History of Present Illness: Crystal Simpson is a 31 y.o. who identifies as a female who was assigned female at birth, and is being seen today for yeast infection.  HPI: 31yo female presents today due to concerns of yeast infection. Was just treated for a yeast infection by her PCP last month, states it cleared up completely with the PO diflucan, but over the past two days it has come back. Pt states this is much less severe than the previous one and is requesting vaginal suppositories instead of PO meds. Reports vaginal discharge and itching, sx similar to previous. Denies pelvic pain, dyuria, flank pain, hematuria, or odor. Denies exposure to STI, not currently sexually active. No rash. No DM.   Problems:  Patient Active Problem List   Diagnosis Date Noted   BV (bacterial vaginosis) 06/28/2022   Hyperlipidemia 06/26/2022   Allergy with anaphylaxis due to food 05/28/2022   Chronic urticaria 04/22/2021   Allergic conjunctivitis of both eyes 04/22/2021   Seasonal and perennial allergic rhinitis 04/22/2021    Allergies:  Allergies  Allergen Reactions   Eggs Or Egg-Derived Products Hives   Shrimp [Shellfish Allergy]     Unknown reaction   Wheat Bran     Unknown reaction    Medications:  Current Outpatient Medications:  miconazole (MICONAZOLE 7) 2 % vaginal cream, Use finger and apply topical cream externally twice daily x 7 days, Disp: 45 g, Rfl: 0   miconazole (MICOTIN) 200 MG vaginal suppository, Place 1 suppository (200 mg total) vaginally at bedtime., Disp: 3 suppository, Rfl: 0   atorvastatin (LIPITOR) 10 MG tablet, Take 1 tablet (10 mg total) by mouth daily., Disp: 30 tablet, Rfl: 2   AUVI-Q 0.3 MG/0.3ML SOAJ injection, , Disp: , Rfl:     cetirizine (ZYRTEC) 10 MG tablet, Take 1 tablet (10 mg total) by mouth 2 (two) times daily., Disp: 60 tablet, Rfl: 5   EPINEPHrine (EPIPEN 2-PAK) 0.3 mg/0.3 mL IJ SOAJ injection, Inject 0.3 mg into the muscle once for 1 dose., Disp: 2 each, Rfl: 2   famotidine (PEPCID) 20 MG tablet, Take 1 tablet (20 mg total) by mouth 2 (two) times daily., Disp: 60 tablet, Rfl: 5   norgestimate-ethinyl estradiol (FEMYNOR) 0.25-35 MG-MCG tablet, Take 1 tablet by mouth daily. SKIP PLACEBO PILLS AND RESTART A NEW PACK EVERY 21 DAYS., Disp: 84 tablet, Rfl: 4   omalizumab (XOLAIR) 150 MG/ML prefilled syringe, INJECT 2 PENS (300 MG) INTO THE SKIN EVERY 28 DAYS., Disp: 2 mL, Rfl: 11  Current Facility-Administered Medications:    omalizumab Geoffry Paradise) injection 300 mg, 300 mg, Subcutaneous, Q28 days, Marcelyn Bruins, MD, 300 mg at 05/06/22 1411  Observations/Objective: Patient is well-developed, well-nourished in no acute distress.  Resting comfortably at home.  Head is normocephalic, atraumatic.  No labored breathing. Speech is clear and coherent with logical content.  Patient is alert and oriented at baseline.  No rash on visualized skin. GU exam not performed.  Assessment and Plan: 1. Vaginal yeast infection  Pt with sx consistent with vaginal yeast infection. She is requesting alternative therapy to repeating PO diflucan. Will do 3 day vaginal suppository in combination with topical miconazole 7 day. F/U with PCP if sx recur for vaginal testing.  Follow Up Instructions: I discussed the assessment and treatment plan with the patient. The patient was provided an opportunity to ask questions and all were answered. The patient agreed with the plan and demonstrated an understanding of the instructions.  A copy of instructions were sent to the patient via MyChart unless otherwise noted below.   The patient was advised to call back or seek an in-person evaluation if the symptoms worsen or if the condition  fails to improve as anticipated.  Time:  I spent 7 minutes with the patient via telehealth technology discussing the above problems/concerns.    Kaitlyn Skowron L Alaa Eyerman, PA

## 2022-08-12 NOTE — Telephone Encounter (Signed)
Message from [place] sent at 08/12/2022  1:32 PM EST  Summary: discharge and discomfort   Pt states she was prescribed diflucan for a yeast infection and is still experiencing discharge and discomfort  Providers next appt is 11-28, pt states she can not wait that long  Please reference pts 11-15 mychart message  Please assist further        Called pt but unable to LM- "call cannot be completed at this time"- Sent pt MyChart message to call back. Currently Dr. Andrey Campanile has an opening tomorrow am.

## 2022-08-13 ENCOUNTER — Other Ambulatory Visit (HOSPITAL_COMMUNITY): Payer: Self-pay

## 2022-08-13 ENCOUNTER — Encounter: Payer: No Typology Code available for payment source | Admitting: Family

## 2022-08-13 NOTE — Progress Notes (Signed)
Erroneous encounter-disregard

## 2022-08-13 NOTE — Telephone Encounter (Signed)
Att to contact pt for an appt today at 1pm, phone directs straight to vm msg left

## 2022-08-17 ENCOUNTER — Other Ambulatory Visit (HOSPITAL_COMMUNITY): Payer: Self-pay

## 2022-08-25 ENCOUNTER — Other Ambulatory Visit (HOSPITAL_COMMUNITY): Payer: Self-pay

## 2022-09-06 ENCOUNTER — Other Ambulatory Visit (HOSPITAL_COMMUNITY): Payer: Self-pay

## 2022-09-07 ENCOUNTER — Other Ambulatory Visit (HOSPITAL_COMMUNITY): Payer: Self-pay

## 2022-09-07 MED ORDER — NORGESTIMATE-ETH ESTRADIOL 0.25-35 MG-MCG PO TABS
1.0000 | ORAL_TABLET | Freq: Every day | ORAL | 0 refills | Status: AC
  Filled 2022-09-07: qty 84, 63d supply, fill #0
  Filled 2022-09-28: qty 84, 84d supply, fill #0

## 2022-09-10 ENCOUNTER — Other Ambulatory Visit (HOSPITAL_COMMUNITY): Payer: Self-pay

## 2022-09-14 ENCOUNTER — Other Ambulatory Visit: Payer: Self-pay

## 2022-09-16 ENCOUNTER — Other Ambulatory Visit: Payer: Self-pay

## 2022-09-21 ENCOUNTER — Other Ambulatory Visit (HOSPITAL_COMMUNITY): Payer: Self-pay

## 2022-09-22 ENCOUNTER — Other Ambulatory Visit (HOSPITAL_COMMUNITY): Payer: Self-pay

## 2022-09-28 ENCOUNTER — Other Ambulatory Visit (HOSPITAL_COMMUNITY): Payer: Self-pay

## 2022-10-04 ENCOUNTER — Other Ambulatory Visit (HOSPITAL_COMMUNITY): Payer: Self-pay

## 2022-10-18 ENCOUNTER — Other Ambulatory Visit (HOSPITAL_COMMUNITY): Payer: Self-pay

## 2022-10-20 ENCOUNTER — Other Ambulatory Visit (HOSPITAL_COMMUNITY): Payer: Self-pay

## 2022-10-25 ENCOUNTER — Other Ambulatory Visit: Payer: Self-pay

## 2022-10-28 ENCOUNTER — Other Ambulatory Visit: Payer: Self-pay

## 2022-10-28 ENCOUNTER — Other Ambulatory Visit (HOSPITAL_COMMUNITY): Payer: Self-pay

## 2022-10-29 ENCOUNTER — Other Ambulatory Visit (HOSPITAL_COMMUNITY): Payer: Self-pay

## 2022-11-01 ENCOUNTER — Other Ambulatory Visit (HOSPITAL_COMMUNITY): Payer: Self-pay

## 2022-11-01 ENCOUNTER — Other Ambulatory Visit: Payer: Self-pay

## 2022-11-02 ENCOUNTER — Other Ambulatory Visit: Payer: Self-pay

## 2022-11-02 ENCOUNTER — Other Ambulatory Visit (HOSPITAL_COMMUNITY): Payer: Self-pay

## 2022-11-03 ENCOUNTER — Other Ambulatory Visit: Payer: Self-pay

## 2022-11-03 ENCOUNTER — Other Ambulatory Visit (HOSPITAL_COMMUNITY): Payer: Self-pay

## 2022-11-04 ENCOUNTER — Other Ambulatory Visit (HOSPITAL_COMMUNITY): Payer: Self-pay

## 2022-11-05 ENCOUNTER — Other Ambulatory Visit (HOSPITAL_COMMUNITY): Payer: Self-pay

## 2022-11-15 ENCOUNTER — Other Ambulatory Visit (HOSPITAL_COMMUNITY): Payer: Self-pay

## 2022-11-17 ENCOUNTER — Encounter: Payer: Self-pay | Admitting: *Deleted

## 2022-11-23 ENCOUNTER — Other Ambulatory Visit (HOSPITAL_COMMUNITY): Payer: Self-pay

## 2022-11-26 ENCOUNTER — Other Ambulatory Visit (HOSPITAL_COMMUNITY): Payer: Self-pay

## 2022-12-12 ENCOUNTER — Other Ambulatory Visit (HOSPITAL_COMMUNITY): Payer: Self-pay

## 2022-12-13 ENCOUNTER — Ambulatory Visit: Payer: 59 | Attending: Family | Admitting: Pharmacist

## 2022-12-13 ENCOUNTER — Other Ambulatory Visit: Payer: Self-pay

## 2022-12-13 ENCOUNTER — Other Ambulatory Visit (HOSPITAL_COMMUNITY): Payer: Self-pay

## 2022-12-13 DIAGNOSIS — Z79899 Other long term (current) drug therapy: Secondary | ICD-10-CM

## 2022-12-13 NOTE — Progress Notes (Signed)
   S: Patient presents for review of their specialty medication therapy.  Patient is currently taking Xolair for chronic urticaria. Patient is managed by Dr. Nelva Bush for this.   Adherence: confirmed. Takes every month.  Efficacy: Reports that the Xolair is working to control things better.  Dosing: Give subcutaneously.   Chronic idiopathic urticaria: SubQ: 150 or 300 mg every 4 weeks. Dosing is not dependent on serum IgE (free or total) level or body weight.  Dose adjustments: Renal: no dose adjustments  Hepatic: no dose adjustments  Toxicity: Severe hypersensitivity reaction or anaphylaxis: Discontinue treatment. Fever, arthralgia, and rash: Discontinue treatment if this constellation of symptoms occurs.  Drug-drug interactions: none identified   Monitoring: CV effects: none Eosinophilia and vasculitis: none Fever/arthralgia/rash: none Hypersensitivity/Anaphylaxis: none Malignant neoplasms: none  O:     Lab Results  Component Value Date   WBC 11.5 (H) 07/28/2022   HGB 11.6 (L) 07/28/2022   HCT 36.2 07/28/2022   MCV 88.9 07/28/2022   PLT 326 07/28/2022      Chemistry      Component Value Date/Time   NA 138 07/28/2022 1330   NA 139 06/24/2021 1542   K 4.4 07/28/2022 1330   CL 106 07/28/2022 1330   CO2 27 07/28/2022 1330   BUN 8 07/28/2022 1330   BUN 7 06/24/2021 1542   CREATININE 0.65 07/28/2022 1330      Component Value Date/Time   CALCIUM 9.0 07/28/2022 1330   ALKPHOS 41 07/28/2022 1330   AST 10 (L) 07/28/2022 1330   ALT 5 07/28/2022 1330   BILITOT 0.3 07/28/2022 1330       A/P: 1. Medication review: patient currently on Xolair for chronic urticaria. Reviewed the medication with the patient, including the following: Xolair, omalizumab, is a novel IgE blocker.  It appears to reduce rates of hospitalizations, ER visits and unscheduled physician visits due asthma exacerbations when added to standard therapy.  Studies also show a reduction in steroid  requirements and improvement in quality of life.  Patient educated on purpose, proper use and potential adverse effects of Xolair.  Following instruction patient verbalized understanding. Patient should always have an EpiPen readily available in the event of anaphylaxis. SubQ: For SubQ injection only; doses >150 mg should be divided over more than one injection site (eg, 225 mg or 300 mg administered as two injections, 375 mg administered as three injections); each injection site should be separated by ?1 inch. Do not inject into moles, scars, bruises, tender areas, or broken skin. Injections may take 5 to 10 seconds to administer (solution is slightly viscous). Administer only under direct medical supervision and observe patient for 2 hours after the first 3 injections and 30 minutes after subsequent injections Dellia Cloud 2015) or in accordance with individual institution policies and procedures.No recommendations for any changes at this time.    Benard Halsted, PharmD, [place], [place] [phone]

## 2022-12-14 ENCOUNTER — Other Ambulatory Visit (HOSPITAL_COMMUNITY): Payer: Self-pay

## 2022-12-16 ENCOUNTER — Other Ambulatory Visit (HOSPITAL_COMMUNITY): Payer: Self-pay

## 2022-12-17 ENCOUNTER — Other Ambulatory Visit: Payer: Self-pay | Admitting: Allergy

## 2022-12-17 ENCOUNTER — Other Ambulatory Visit: Payer: Self-pay | Admitting: Pharmacist

## 2022-12-17 ENCOUNTER — Other Ambulatory Visit: Payer: Self-pay

## 2022-12-17 ENCOUNTER — Other Ambulatory Visit (HOSPITAL_COMMUNITY): Payer: Self-pay

## 2022-12-17 MED ORDER — OMALIZUMAB 150 MG/ML ~~LOC~~ SOSY
300.0000 mg | PREFILLED_SYRINGE | SUBCUTANEOUS | 11 refills | Status: DC
  Filled ????-??-??: fill #0

## 2022-12-17 MED ORDER — OMALIZUMAB 150 MG/ML ~~LOC~~ SOSY
300.0000 mg | PREFILLED_SYRINGE | SUBCUTANEOUS | 11 refills | Status: AC
  Filled 2022-12-17: qty 2, 28d supply, fill #0
  Filled 2023-01-24: qty 2, 28d supply, fill #1
  Filled 2023-02-23: qty 2, 28d supply, fill #2
  Filled 2023-03-22: qty 2, 28d supply, fill #3
  Filled 2023-04-20: qty 2, 28d supply, fill #4
  Filled 2023-04-20: qty 4, 56d supply, fill #4
  Filled 2023-05-24: qty 2, 28d supply, fill #5

## 2022-12-24 ENCOUNTER — Other Ambulatory Visit (HOSPITAL_COMMUNITY): Payer: Self-pay

## 2022-12-27 ENCOUNTER — Other Ambulatory Visit: Payer: Self-pay

## 2022-12-28 ENCOUNTER — Other Ambulatory Visit: Payer: Self-pay

## 2022-12-30 ENCOUNTER — Other Ambulatory Visit (HOSPITAL_COMMUNITY): Payer: Self-pay

## 2023-01-03 ENCOUNTER — Other Ambulatory Visit (HOSPITAL_COMMUNITY): Payer: Self-pay

## 2023-01-03 DIAGNOSIS — Z124 Encounter for screening for malignant neoplasm of cervix: Secondary | ICD-10-CM | POA: Diagnosis not present

## 2023-01-03 DIAGNOSIS — N852 Hypertrophy of uterus: Secondary | ICD-10-CM | POA: Diagnosis not present

## 2023-01-03 DIAGNOSIS — Z01411 Encounter for gynecological examination (general) (routine) with abnormal findings: Secondary | ICD-10-CM | POA: Diagnosis not present

## 2023-01-03 DIAGNOSIS — Z130 Encounter for screening for diseases of the blood and blood-forming organs and certain disorders involving the immune mechanism: Secondary | ICD-10-CM | POA: Diagnosis not present

## 2023-01-03 DIAGNOSIS — Z113 Encounter for screening for infections with a predominantly sexual mode of transmission: Secondary | ICD-10-CM | POA: Diagnosis not present

## 2023-01-03 DIAGNOSIS — Z1389 Encounter for screening for other disorder: Secondary | ICD-10-CM | POA: Diagnosis not present

## 2023-01-03 DIAGNOSIS — R87810 Cervical high risk human papillomavirus (HPV) DNA test positive: Secondary | ICD-10-CM | POA: Diagnosis not present

## 2023-01-03 DIAGNOSIS — Z3041 Encounter for surveillance of contraceptive pills: Secondary | ICD-10-CM | POA: Diagnosis not present

## 2023-01-03 MED ORDER — NORGESTIMATE-ETH ESTRADIOL 0.25-35 MG-MCG PO TABS
1.0000 | ORAL_TABLET | Freq: Every day | ORAL | 4 refills | Status: AC
  Filled 2023-01-03: qty 84, 63d supply, fill #0
  Filled 2023-03-15: qty 112, 84d supply, fill #1

## 2023-01-19 ENCOUNTER — Telehealth: Payer: Self-pay | Admitting: Hematology and Oncology

## 2023-01-20 ENCOUNTER — Other Ambulatory Visit (HOSPITAL_COMMUNITY): Payer: Self-pay

## 2023-01-21 ENCOUNTER — Inpatient Hospital Stay: Payer: 59 | Admitting: Hematology and Oncology

## 2023-01-21 ENCOUNTER — Inpatient Hospital Stay: Payer: 59

## 2023-01-24 ENCOUNTER — Other Ambulatory Visit: Payer: Self-pay

## 2023-01-24 ENCOUNTER — Other Ambulatory Visit (HOSPITAL_COMMUNITY): Payer: Self-pay

## 2023-01-25 ENCOUNTER — Other Ambulatory Visit: Payer: Self-pay

## 2023-02-15 DIAGNOSIS — D259 Leiomyoma of uterus, unspecified: Secondary | ICD-10-CM | POA: Diagnosis not present

## 2023-02-15 DIAGNOSIS — N852 Hypertrophy of uterus: Secondary | ICD-10-CM | POA: Diagnosis not present

## 2023-02-23 ENCOUNTER — Other Ambulatory Visit (HOSPITAL_COMMUNITY): Payer: Self-pay

## 2023-02-28 ENCOUNTER — Other Ambulatory Visit: Payer: Self-pay | Admitting: Hematology and Oncology

## 2023-02-28 ENCOUNTER — Other Ambulatory Visit (HOSPITAL_COMMUNITY): Payer: Self-pay

## 2023-02-28 ENCOUNTER — Inpatient Hospital Stay: Payer: 59 | Attending: Hematology and Oncology

## 2023-02-28 ENCOUNTER — Inpatient Hospital Stay: Payer: 59 | Admitting: Hematology and Oncology

## 2023-02-28 VITALS — BP 128/84 | HR 82 | Temp 98.7°F | Resp 16 | Wt 192.9 lb

## 2023-02-28 DIAGNOSIS — Z8249 Family history of ischemic heart disease and other diseases of the circulatory system: Secondary | ICD-10-CM | POA: Diagnosis not present

## 2023-02-28 DIAGNOSIS — Z79899 Other long term (current) drug therapy: Secondary | ICD-10-CM | POA: Diagnosis not present

## 2023-02-28 DIAGNOSIS — D72825 Bandemia: Secondary | ICD-10-CM | POA: Diagnosis not present

## 2023-02-28 DIAGNOSIS — Z840 Family history of diseases of the skin and subcutaneous tissue: Secondary | ICD-10-CM | POA: Insufficient documentation

## 2023-02-28 DIAGNOSIS — D649 Anemia, unspecified: Secondary | ICD-10-CM | POA: Diagnosis not present

## 2023-02-28 DIAGNOSIS — Z8261 Family history of arthritis: Secondary | ICD-10-CM | POA: Insufficient documentation

## 2023-02-28 DIAGNOSIS — Z833 Family history of diabetes mellitus: Secondary | ICD-10-CM | POA: Diagnosis not present

## 2023-02-28 DIAGNOSIS — D72829 Elevated white blood cell count, unspecified: Secondary | ICD-10-CM | POA: Insufficient documentation

## 2023-02-28 DIAGNOSIS — Z820 Family history of epilepsy and other diseases of the nervous system: Secondary | ICD-10-CM | POA: Diagnosis not present

## 2023-02-28 LAB — CMP (CANCER CENTER ONLY)
ALT: 8 U/L (ref 0–44)
AST: 13 U/L — ABNORMAL LOW (ref 15–41)
Albumin: 4.1 g/dL (ref 3.5–5.0)
Alkaline Phosphatase: 41 U/L (ref 38–126)
Anion gap: 8 (ref 5–15)
BUN: 10 mg/dL (ref 6–20)
CO2: 25 mmol/L (ref 22–32)
Calcium: 9.3 mg/dL (ref 8.9–10.3)
Chloride: 105 mmol/L (ref 98–111)
Creatinine: 0.68 mg/dL (ref 0.44–1.00)
GFR, Estimated: >60 mL/min (ref >60)
Glucose, Bld: 125 mg/dL — ABNORMAL HIGH (ref 70–99)
Potassium: 3.6 mmol/L (ref 3.5–5.1)
Sodium: 138 mmol/L (ref 135–145)
Total Bilirubin: 0.5 mg/dL (ref 0.3–1.2)
Total Protein: 7.5 g/dL (ref 6.5–8.1)

## 2023-02-28 LAB — CBC WITH DIFFERENTIAL (CANCER CENTER ONLY)
Abs Immature Granulocytes: 0.03 10*3/uL (ref 0.00–0.07)
Basophils Absolute: 0 10*3/uL (ref 0.0–0.1)
Basophils Relative: 0 %
Eosinophils Absolute: 0.2 10*3/uL (ref 0.0–0.5)
Eosinophils Relative: 1 %
HCT: 37.3 % (ref 36.0–46.0)
Hemoglobin: 12.1 g/dL (ref 12.0–15.0)
Immature Granulocytes: 0 %
Lymphocytes Relative: 24 %
Lymphs Abs: 2.7 10*3/uL (ref 0.7–4.0)
MCH: 28.5 pg (ref 26.0–34.0)
MCHC: 32.4 g/dL (ref 30.0–36.0)
MCV: 88 fL (ref 80.0–100.0)
Monocytes Absolute: 0.9 10*3/uL (ref 0.1–1.0)
Monocytes Relative: 8 %
Neutro Abs: 7.6 10*3/uL (ref 1.7–7.7)
Neutrophils Relative %: 67 %
Platelet Count: 330 10*3/uL (ref 150–400)
RBC: 4.24 MIL/uL (ref 3.87–5.11)
RDW: 13.3 % (ref 11.5–15.5)
WBC Count: 11.5 10*3/uL — ABNORMAL HIGH (ref 4.0–10.5)
nRBC: 0 % (ref 0.0–0.2)

## 2023-02-28 NOTE — Progress Notes (Signed)
[facility] [phone]   [phone]  PROGRESS NOTE  Patient Care Team: Rema Fendt, NP as PCP - General (Nurse Practitioner)  Hematological/Oncological History # Leukocytosis 11/21/2015: WBC 14.5, Hgb 12.7, MCV 90, Plt 289 07/11/2019: WBC 14.6, Hgb 12.5, MCV 87, Plt 306 08/08/2019: WBC 13.1, Hgb 11.9, MCV 89, Plt 321. ANC 8000. ESR 36 02/05/2020: WBC 12.7, Hgb 11.2, Plt 287 06/24/2021: WBC 16.3, Hgb 11.6, Plt 360, MCV 88 07/01/2021: establish care with Dr. Leonides Schanz   Interval History:  Crystal Simpson 32 y.o. 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 The patient's last visit was on 01/22/2022. In the interim since the last visit she has had no major changes in her health.   On exam today Crystal Simpson notes in the last 6 months she has "been fine".  She has had no hospitalizations, emergency room visits, or changes in medications.  She has had no rashes or infectious symptoms such as fevers, chills, sweats, nausea, vomiting or diarrhea.  She notes she is not having a cough, shortness of breath, or runny nose.  She notes her energy is about a 7-10.  She notes that her appetite is strong and she is eating well.  She is not having any lightheadedness, dizziness, or shortness of breath.  She has not had any changes in her urinary symptoms.  Overall she is at her baseline level of health with no questions concerns or complaints today.  She currently denies any nausea, vomiting, or diarrhea.  A full 10 point ROS is listed below.  MEDICAL HISTORY:  Past Medical History:  Diagnosis Date   Urticaria     SURGICAL HISTORY: Past Surgical History:  Procedure Laterality Date   CESAREAN SECTION     WISDOM TOOTH EXTRACTION      SOCIAL HISTORY: Social History   Socioeconomic History   Marital status: Divorced    Spouse name: Not on  file   Number of children: Not on file   Years of education: Not on file   Highest education level: Not on file  Occupational History   Occupation: nurse  Tobacco Use   Smoking status: Never   Smokeless tobacco: Never  Vaping Use   Vaping Use: Never used  Substance and Sexual Activity   Alcohol use: Yes    Comment: occ   Drug use: No   Sexual activity: Yes    Birth control/protection: I.U.D.  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[employer]   Social Determinants of Health   Financial Resource Strain: Low Risk  (07/11/2019)   Overall Financial Resource Strain (CARDIA)    Difficulty of Paying Living Expenses: Not hard at all  Food Insecurity: No Food Insecurity (07/11/2019)   Hunger Vital Sign    Worried About Running Out of Food in the Last Year: Never true    Ran Out of Food in the Last Year: Never true  Transportation Needs: No Transportation Needs (07/11/2019)   PRAPARE - Administrator, Civil Service (Medical): No    Lack of Transportation (Non-Medical): No  Physical Activity: Not on file  Stress: Not on file  Social Connections: Unknown (07/11/2019)   Social Connection and Isolation Panel [NHANES]    Frequency of Communication with Friends and Family: Not on file    Frequency of Social Gatherings  with Friends and Family: Not on file    Attends Religious Services: Not on file    Active Member of Clubs or Organizations: Not on file    Attends Club or Organization Meetings: Not on file    Marital Status: Married  Intimate Partner Violence: Not At Risk (07/11/2019)   Humiliation, Afraid, Rape, and Kick questionnaire    Fear of Current or Ex-Partner: No    Emotionally Abused: No    Physically Abused: No    Sexually Abused: No    FAMILY HISTORY: Family History  Problem Relation Age of Onset   Rheum arthritis Mother    Eczema Mother    Healthy Father    Healthy Sister    Healthy Brother    Healthy Daughter    Diabetes  Maternal Grandmother    Hypertension Maternal Grandfather    Parkinson's disease Paternal Grandmother     ALLERGIES:  is allergic to egg-derived products, shrimp [shellfish allergy], and wheat.  MEDICATIONS:  Current Outpatient Medications  Medication Sig Dispense Refill   atorvastatin (LIPITOR) 10 MG tablet Take 1 tablet (10 mg total) by mouth daily. 30 tablet 2   AUVI-Q 0.3 MG/0.3ML SOAJ injection      cetirizine (ZYRTEC) 10 MG tablet Take 1 tablet (10 mg total) by mouth 2 (two) times daily. 60 tablet 5   EPINEPHrine (EPIPEN 2-PAK) 0.3 mg/0.3 mL IJ SOAJ injection Inject 0.3 mg into the muscle once for 1 dose. 2 each 2   famotidine (PEPCID) 20 MG tablet Take 1 tablet (20 mg total) by mouth 2 (two) times daily. 60 tablet 5   miconazole (MICONAZOLE 7) 2 % vaginal cream Use finger and apply topical cream externally twice daily x 7 days 45 g 0   miconazole (MICOTIN) 200 MG vaginal suppository Place 1 suppository (200 mg total) vaginally at bedtime. 3 suppository 0   norgestimate-ethinyl estradiol (VYLIBRA) 0.25-35 MG-MCG tablet Take 1 tablet by mouth daily. Skip placebo pills and restart a new pack every 21 days 84 tablet 0   norgestimate-ethinyl estradiol (VYLIBRA) 0.25-35 MG-MCG tablet Take 1 tablet by mouth daily. Skip placebo pills and restart a new pack every 21 days 84 tablet 4   omalizumab (XOLAIR) 150 MG/ML prefilled syringe INJECT 2 PENS (300 MG) INTO THE SKIN EVERY 28 DAYS. 2 mL 11   Current Facility-Administered Medications  Medication Dose Route Frequency Provider Last Rate Last Admin   omalizumab Geoffry Paradise) injection 300 mg  300 mg Subcutaneous Q28 days Marcelyn Bruins, MD   300 mg at 05/06/22 1411    REVIEW OF SYSTEMS:   Constitutional: ( - ) fevers, ( - )  chills , ( - ) night sweats Eyes: ( - ) blurriness of vision, ( - ) double vision, ( - ) watery eyes Ears, nose, mouth, throat, and face: ( - ) mucositis, ( - ) sore throat Respiratory: ( - ) cough, ( - ) dyspnea,  ( - ) wheezes Cardiovascular: ( - ) palpitation, ( - ) chest discomfort, ( - ) lower extremity swelling Gastrointestinal:  ( - ) nausea, ( - ) heartburn, ( - ) change in bowel habits Skin: ( - ) abnormal skin rashes Lymphatics: ( - ) new lymphadenopathy, ( - ) easy bruising Neurological: ( - ) numbness, ( - ) tingling, ( - ) new weaknesses Behavioral/Psych: ( - ) mood change, ( - ) new changes  All other systems were reviewed with the patient and are negative.  PHYSICAL EXAMINATION:  There were  no vitals filed for this visit.  There were no vitals filed for this visit.   GENERAL: Well-appearing young African-American female.  Alert, no distress and comfortable SKIN: skin color, texture, turgor are normal, no rashes or significant lesions EYES: conjunctiva are pink and non-injected, sclera clear LUNGS: clear to auscultation and percussion with normal breathing effort HEART: regular rate & rhythm and no murmurs and no lower extremity edema Musculoskeletal: no cyanosis of digits and no clubbing  PSYCH: alert & oriented x 3, fluent speech NEURO: no focal motor/sensory deficits  LABORATORY DATA:  I have reviewed the data as listed    Latest Ref Rng & Units 07/28/2022    1:30 PM 01/22/2022   10:50 AM 07/01/2021    9:37 AM  CBC  WBC 4.0 - 10.5 K/uL 11.5  14.7  12.2   Hemoglobin 12.0 - 15.0 g/dL 09.8  11.9  14.7   Hematocrit 36.0 - 46.0 % 36.2  35.9  36.8   Platelets 150 - 400 K/uL 326  327  325        Latest Ref Rng & Units 07/28/2022    1:30 PM 01/22/2022   10:50 AM 07/01/2021    9:37 AM  CMP  Glucose 70 - 99 mg/dL 98  90  829   BUN 6 - 20 mg/dL 8  11  9    Creatinine 0.44 - 1.00 mg/dL 5.62  1.30  8.65   Sodium 135 - 145 mmol/L 138  137  141   Potassium 3.5 - 5.1 mmol/L 4.4  3.9  4.2   Chloride 98 - 111 mmol/L 106  105  108   CO2 22 - 32 mmol/L 27  25  24    Calcium 8.9 - 10.3 mg/dL 9.0  9.0  9.6   Total Protein 6.5 - 8.1 g/dL 7.4  7.3  7.5   Total Bilirubin 0.3 - 1.2 mg/dL  0.3  0.5  0.4   Alkaline Phos 38 - 126 U/L 41  42  45   AST 15 - 41 U/L 10  10  14    ALT 0 - 44 U/L 5  6  6     RADIOGRAPHIC STUDIES: No results found.  ASSESSMENT & PLAN Crystal Simpson 31 y.o. female with medical history significant for leukocytosis who presents for a follow up visit.   Previously we discussed the results of the NGS testing which showed ASXL1 mutation.  This is a tier 3 mutation also known as a variant of unknown clinical significance.  The long-term outcome from imitation of this nature is unclear.  There is no need for intervention at this time.  Overall would recommend continued monitoring of her blood counts.  She does have mild anemia today and at her next visit we will check iron levels to assure that she is not iron deficient.  #Neutrophilia/Leukocytosis -- Findings are consistent with neutrophilia and leukocytosis secondary to ASXL1 mutation, noted on NGS testing.  --labs today show white blood cell 11.5, hemoglobin 9.6, MCV 88.9, and platelets of 326 --RTC in 12 months for a clinic visit.   No orders of the defined types were placed in this encounter.   All questions were answered. The patient knows to call the clinic with any problems, questions or concerns.  A total of more than 25 minutes were spent on this encounter with face-to-face time and non-face-to-face time, including preparing to see the patient, ordering tests and/or medications, counseling the patient and coordination of care as outlined above.  Ulysees Barns, MD Department of Hematology/Oncology [facility] at [facility] Phone: [phone] Pager: [phone] Email: [email or web address]  02/28/2023 7:23 AM

## 2023-03-07 ENCOUNTER — Telehealth: Payer: Self-pay | Admitting: Family

## 2023-03-07 NOTE — Telephone Encounter (Signed)
Called Pt and left a voicemail to call back office to schedule Physical/annual

## 2023-03-16 ENCOUNTER — Other Ambulatory Visit (HOSPITAL_COMMUNITY): Payer: Self-pay

## 2023-03-16 ENCOUNTER — Other Ambulatory Visit: Payer: Self-pay

## 2023-03-22 ENCOUNTER — Other Ambulatory Visit (HOSPITAL_COMMUNITY): Payer: Self-pay

## 2023-03-22 ENCOUNTER — Telehealth: Payer: Self-pay | Admitting: Family

## 2023-03-22 NOTE — Telephone Encounter (Signed)
Called pt to schedule annual physical per MyChart appt request; left vm for pt to call back to schedule.

## 2023-03-25 ENCOUNTER — Other Ambulatory Visit (HOSPITAL_COMMUNITY): Payer: Self-pay

## 2023-03-28 ENCOUNTER — Other Ambulatory Visit (HOSPITAL_COMMUNITY): Payer: Self-pay

## 2023-04-20 ENCOUNTER — Other Ambulatory Visit (HOSPITAL_COMMUNITY): Payer: Self-pay

## 2023-04-21 ENCOUNTER — Other Ambulatory Visit (HOSPITAL_COMMUNITY): Payer: Self-pay

## 2023-04-21 ENCOUNTER — Telehealth: Payer: 59 | Admitting: Nurse Practitioner

## 2023-04-21 DIAGNOSIS — R3989 Other symptoms and signs involving the genitourinary system: Secondary | ICD-10-CM | POA: Diagnosis not present

## 2023-04-21 MED ORDER — CEPHALEXIN 500 MG PO CAPS
500.0000 mg | ORAL_CAPSULE | Freq: Two times a day (BID) | ORAL | 0 refills | Status: AC
Start: 2023-04-21 — End: 2023-04-28
  Filled 2023-04-21: qty 14, 7d supply, fill #0

## 2023-04-21 NOTE — Progress Notes (Signed)
E-Visit for Urinary Problems  We are sorry that you are not feeling well.  Here is how we plan to help!  Based on what you shared with me it looks like you most likely have a simple urinary tract infection.  A UTI (Urinary Tract Infection) is a bacterial infection of the bladder.  Most cases of urinary tract infections are simple to treat but a key part of your care is to encourage you to drink plenty of fluids and watch your symptoms carefully.  I have prescribed Keflex 500 mg twice a day for 7 days.  Your symptoms should gradually improve. Call us if the burning in your urine worsens, you develop worsening fever, back pain or pelvic pain or if your symptoms do not resolve after completing the antibiotic.  Urinary tract infections can be prevented by drinking plenty of water to keep your body hydrated.  Also be sure when you wipe, wipe from front to back and don't hold it in!  If possible, empty your bladder every 4 hours.  HOME CARE Drink plenty of fluids Compete the full course of the antibiotics even if the symptoms resolve Remember, when you need to go.go. Holding in your urine can increase the likelihood of getting a UTI! GET HELP RIGHT AWAY IF: You cannot urinate You get a high fever Worsening back pain occurs You see blood in your urine You feel sick to your stomach or throw up You feel like you are going to pass out  MAKE SURE YOU  Understand these instructions. Will watch your condition. Will get help right away if you are not doing well or get worse.   Thank you for choosing an e-visit.  Your e-visit answers were reviewed by a board certified advanced clinical practitioner to complete your personal care plan. Depending upon the condition, your plan could have included both over the counter or prescription medications.  Please review your pharmacy choice. Make sure the pharmacy is open so you can pick up prescription now. If there is a problem, you may contact your  provider through MyChart messaging and have the prescription routed to another pharmacy.  Your safety is important to us. If you have drug allergies check your prescription carefully.   For the next 24 hours you can use MyChart to ask questions about today's visit, request a non-urgent call back, or ask for a work or school excuse. You will get an email in the next two days asking about your experience. I hope that your e-visit has been valuable and will speed your recovery.   Meds ordered this encounter  Medications   cephALEXin (KEFLEX) 500 MG capsule    Sig: Take 1 capsule (500 mg total) by mouth 2 (two) times daily for 7 days.    Dispense:  14 capsule    Refill:  0    I spent approximately 5 minutes reviewing the patient's history, current symptoms and coordinating their care today.   

## 2023-04-26 ENCOUNTER — Other Ambulatory Visit: Payer: Self-pay

## 2023-05-24 ENCOUNTER — Other Ambulatory Visit (HOSPITAL_COMMUNITY): Payer: Self-pay

## 2023-06-18 ENCOUNTER — Encounter (HOSPITAL_COMMUNITY): Payer: Self-pay

## 2023-10-07 ENCOUNTER — Telehealth: Payer: Self-pay | Admitting: Family Medicine

## 2023-10-07 NOTE — Telephone Encounter (Addendum)
 Patient states she has transferred care and moved to [place] so she is no longer needing Xolair from [place].

## 2023-10-19 NOTE — Telephone Encounter (Signed)
NOTED

## 2023-11-03 ENCOUNTER — Other Ambulatory Visit (HOSPITAL_COMMUNITY): Payer: Self-pay

## 2024-03-01 ENCOUNTER — Telehealth: Payer: Self-pay | Admitting: Hematology and Oncology

## 2024-03-02 ENCOUNTER — Other Ambulatory Visit: Payer: 59

## 2024-03-02 ENCOUNTER — Ambulatory Visit: Payer: 59 | Admitting: Hematology and Oncology
